# Patient Record
Sex: Male | Born: 1935 | Race: White | Hispanic: No | Marital: Married | Smoking: Former smoker
Health system: Southern US, Community
[De-identification: ages and names within clinical notes are randomized; demographics above are authoritative.]

## PROBLEM LIST (undated history)

## (undated) DIAGNOSIS — G8929 Other chronic pain: Secondary | ICD-10-CM

## (undated) DIAGNOSIS — F329 Major depressive disorder, single episode, unspecified: Secondary | ICD-10-CM

## (undated) DIAGNOSIS — E785 Hyperlipidemia, unspecified: Secondary | ICD-10-CM

## (undated) DIAGNOSIS — M542 Cervicalgia: Secondary | ICD-10-CM

## (undated) DIAGNOSIS — K219 Gastro-esophageal reflux disease without esophagitis: Secondary | ICD-10-CM

## (undated) DIAGNOSIS — N400 Enlarged prostate without lower urinary tract symptoms: Secondary | ICD-10-CM

## (undated) DIAGNOSIS — E079 Disorder of thyroid, unspecified: Secondary | ICD-10-CM

## (undated) DIAGNOSIS — K5730 Diverticulosis of large intestine without perforation or abscess without bleeding: Secondary | ICD-10-CM

## (undated) DIAGNOSIS — K279 Peptic ulcer, site unspecified, unspecified as acute or chronic, without hemorrhage or perforation: Secondary | ICD-10-CM

## (undated) DIAGNOSIS — K635 Polyp of colon: Secondary | ICD-10-CM

## (undated) DIAGNOSIS — K8070 Calculus of gallbladder and bile duct without cholecystitis without obstruction: Secondary | ICD-10-CM

## (undated) DIAGNOSIS — I10 Essential (primary) hypertension: Secondary | ICD-10-CM

## (undated) DIAGNOSIS — I2510 Atherosclerotic heart disease of native coronary artery without angina pectoris: Secondary | ICD-10-CM

## (undated) DIAGNOSIS — R911 Solitary pulmonary nodule: Secondary | ICD-10-CM

## (undated) DIAGNOSIS — F32A Depression, unspecified: Secondary | ICD-10-CM

## (undated) DIAGNOSIS — J449 Chronic obstructive pulmonary disease, unspecified: Secondary | ICD-10-CM

## (undated) DIAGNOSIS — I729 Aneurysm of unspecified site: Secondary | ICD-10-CM

## (undated) DIAGNOSIS — M8580 Other specified disorders of bone density and structure, unspecified site: Secondary | ICD-10-CM

## (undated) DIAGNOSIS — M47816 Spondylosis without myelopathy or radiculopathy, lumbar region: Secondary | ICD-10-CM

## (undated) HISTORY — DX: Calculus of gallbladder and bile duct without cholecystitis without obstruction: K80.70

## (undated) HISTORY — DX: Polyp of colon: K63.5

## (undated) HISTORY — DX: Major depressive disorder, single episode, unspecified: F32.9

## (undated) HISTORY — DX: Disorder of thyroid, unspecified: E07.9

## (undated) HISTORY — DX: Depression, unspecified: F32.A

## (undated) HISTORY — DX: Atherosclerotic heart disease of native coronary artery without angina pectoris: I25.10

## (undated) HISTORY — PX: INGUINAL HERNIA REPAIR: SHX194

## (undated) HISTORY — DX: Essential (primary) hypertension: I10

## (undated) HISTORY — DX: Chronic obstructive pulmonary disease, unspecified: J44.9

## (undated) HISTORY — DX: Gastro-esophageal reflux disease without esophagitis: K21.9

## (undated) HISTORY — DX: Hyperlipidemia, unspecified: E78.5

## (undated) HISTORY — DX: Other chronic pain: G89.29

## (undated) HISTORY — DX: Other specified disorders of bone density and structure, unspecified site: M85.80

## (undated) HISTORY — DX: Peptic ulcer, site unspecified, unspecified as acute or chronic, without hemorrhage or perforation: K27.9

## (undated) HISTORY — DX: Diverticulosis of large intestine without perforation or abscess without bleeding: K57.30

## (undated) HISTORY — DX: Cervicalgia: M54.2

## (undated) HISTORY — DX: Aneurysm of unspecified site: I72.9

## (undated) HISTORY — DX: Spondylosis without myelopathy or radiculopathy, lumbar region: M47.816

## (undated) HISTORY — DX: Solitary pulmonary nodule: R91.1

## (undated) HISTORY — DX: Benign prostatic hyperplasia without lower urinary tract symptoms: N40.0

## (undated) HISTORY — PX: ARTHOSCOPIC ROTAOR CUFF REPAIR: SHX5002

---

## 1976-12-18 HISTORY — PX: OTHER SURGICAL HISTORY: SHX169

## 2001-02-18 ENCOUNTER — Encounter (INDEPENDENT_AMBULATORY_CARE_PROVIDER_SITE_OTHER): Payer: Self-pay

## 2001-02-18 ENCOUNTER — Other Ambulatory Visit: Admission: RE | Admit: 2001-02-18 | Discharge: 2001-02-18 | Payer: Self-pay | Admitting: Gastroenterology

## 2001-03-07 ENCOUNTER — Encounter: Admission: RE | Admit: 2001-03-07 | Discharge: 2001-06-05 | Payer: Self-pay | Admitting: Neurology

## 2001-12-31 ENCOUNTER — Encounter: Payer: Self-pay | Admitting: Specialist

## 2002-01-07 ENCOUNTER — Inpatient Hospital Stay (HOSPITAL_COMMUNITY): Admission: RE | Admit: 2002-01-07 | Discharge: 2002-01-08 | Payer: Self-pay | Admitting: Orthopedic Surgery

## 2002-08-27 ENCOUNTER — Encounter: Payer: Self-pay | Admitting: Internal Medicine

## 2002-08-27 ENCOUNTER — Encounter: Admission: RE | Admit: 2002-08-27 | Discharge: 2002-08-27 | Payer: Self-pay | Admitting: Internal Medicine

## 2002-11-27 ENCOUNTER — Ambulatory Visit (HOSPITAL_BASED_OUTPATIENT_CLINIC_OR_DEPARTMENT_OTHER): Admission: RE | Admit: 2002-11-27 | Discharge: 2002-11-28 | Payer: Self-pay | Admitting: Orthopedic Surgery

## 2003-02-13 ENCOUNTER — Encounter: Admission: RE | Admit: 2003-02-13 | Discharge: 2003-02-13 | Payer: Self-pay | Admitting: Rheumatology

## 2003-02-13 ENCOUNTER — Encounter: Payer: Self-pay | Admitting: Rheumatology

## 2003-06-29 ENCOUNTER — Emergency Department (HOSPITAL_COMMUNITY): Admission: EM | Admit: 2003-06-29 | Discharge: 2003-06-29 | Payer: Self-pay | Admitting: Emergency Medicine

## 2003-12-31 ENCOUNTER — Encounter: Admission: RE | Admit: 2003-12-31 | Discharge: 2003-12-31 | Payer: Self-pay | Admitting: Internal Medicine

## 2004-12-02 ENCOUNTER — Encounter: Admission: RE | Admit: 2004-12-02 | Discharge: 2004-12-02 | Payer: Self-pay | Admitting: Specialist

## 2004-12-21 ENCOUNTER — Encounter: Admission: RE | Admit: 2004-12-21 | Discharge: 2004-12-21 | Payer: Self-pay | Admitting: Specialist

## 2005-01-13 ENCOUNTER — Encounter: Admission: RE | Admit: 2005-01-13 | Discharge: 2005-01-13 | Payer: Self-pay | Admitting: Specialist

## 2005-02-03 ENCOUNTER — Ambulatory Visit: Payer: Self-pay | Admitting: Internal Medicine

## 2005-03-03 ENCOUNTER — Ambulatory Visit: Payer: Self-pay | Admitting: Internal Medicine

## 2005-03-20 ENCOUNTER — Ambulatory Visit: Payer: Self-pay | Admitting: Internal Medicine

## 2005-04-11 ENCOUNTER — Emergency Department (HOSPITAL_COMMUNITY): Admission: EM | Admit: 2005-04-11 | Discharge: 2005-04-12 | Payer: Self-pay | Admitting: Emergency Medicine

## 2005-04-21 ENCOUNTER — Emergency Department (HOSPITAL_COMMUNITY): Admission: EM | Admit: 2005-04-21 | Discharge: 2005-04-21 | Payer: Self-pay | Admitting: Family Medicine

## 2005-05-03 ENCOUNTER — Encounter: Admission: RE | Admit: 2005-05-03 | Discharge: 2005-05-03 | Payer: Self-pay | Admitting: Urology

## 2005-05-04 ENCOUNTER — Ambulatory Visit (HOSPITAL_COMMUNITY): Admission: RE | Admit: 2005-05-04 | Discharge: 2005-05-04 | Payer: Self-pay | Admitting: Urology

## 2005-05-04 ENCOUNTER — Ambulatory Visit (HOSPITAL_BASED_OUTPATIENT_CLINIC_OR_DEPARTMENT_OTHER): Admission: RE | Admit: 2005-05-04 | Discharge: 2005-05-04 | Payer: Self-pay | Admitting: Urology

## 2005-05-04 ENCOUNTER — Encounter (INDEPENDENT_AMBULATORY_CARE_PROVIDER_SITE_OTHER): Payer: Self-pay | Admitting: Specialist

## 2005-05-11 ENCOUNTER — Ambulatory Visit (HOSPITAL_COMMUNITY): Admission: RE | Admit: 2005-05-11 | Discharge: 2005-05-11 | Payer: Self-pay | Admitting: Urology

## 2005-05-11 ENCOUNTER — Encounter (INDEPENDENT_AMBULATORY_CARE_PROVIDER_SITE_OTHER): Payer: Self-pay | Admitting: Specialist

## 2005-06-21 ENCOUNTER — Encounter: Admission: RE | Admit: 2005-06-21 | Discharge: 2005-08-24 | Payer: Self-pay | Admitting: Internal Medicine

## 2005-09-01 ENCOUNTER — Ambulatory Visit: Payer: Self-pay | Admitting: Internal Medicine

## 2006-03-29 ENCOUNTER — Ambulatory Visit: Payer: Self-pay | Admitting: Internal Medicine

## 2006-03-30 ENCOUNTER — Ambulatory Visit: Payer: Self-pay | Admitting: Internal Medicine

## 2006-04-04 ENCOUNTER — Inpatient Hospital Stay (HOSPITAL_BASED_OUTPATIENT_CLINIC_OR_DEPARTMENT_OTHER): Admission: RE | Admit: 2006-04-04 | Discharge: 2006-04-04 | Payer: Self-pay | Admitting: Internal Medicine

## 2006-04-04 ENCOUNTER — Ambulatory Visit: Payer: Self-pay | Admitting: Internal Medicine

## 2006-04-13 ENCOUNTER — Ambulatory Visit: Payer: Self-pay | Admitting: Internal Medicine

## 2006-04-17 ENCOUNTER — Ambulatory Visit: Payer: Self-pay | Admitting: Cardiology

## 2006-05-10 ENCOUNTER — Ambulatory Visit: Payer: Self-pay | Admitting: Internal Medicine

## 2006-08-16 ENCOUNTER — Ambulatory Visit: Payer: Self-pay | Admitting: Internal Medicine

## 2006-09-26 ENCOUNTER — Ambulatory Visit: Payer: Self-pay | Admitting: Internal Medicine

## 2006-10-19 ENCOUNTER — Emergency Department (HOSPITAL_COMMUNITY): Admission: EM | Admit: 2006-10-19 | Discharge: 2006-10-19 | Payer: Self-pay | Admitting: Emergency Medicine

## 2006-11-06 ENCOUNTER — Ambulatory Visit: Payer: Self-pay | Admitting: Internal Medicine

## 2006-11-15 ENCOUNTER — Ambulatory Visit: Payer: Self-pay | Admitting: Internal Medicine

## 2006-11-16 ENCOUNTER — Ambulatory Visit: Payer: Self-pay | Admitting: Cardiology

## 2006-11-16 ENCOUNTER — Ambulatory Visit: Payer: Self-pay | Admitting: Gastroenterology

## 2006-11-22 ENCOUNTER — Ambulatory Visit: Payer: Self-pay | Admitting: Gastroenterology

## 2006-11-29 ENCOUNTER — Ambulatory Visit: Payer: Self-pay | Admitting: Gastroenterology

## 2006-12-04 ENCOUNTER — Ambulatory Visit: Payer: Self-pay | Admitting: Gastroenterology

## 2006-12-17 ENCOUNTER — Ambulatory Visit: Payer: Self-pay | Admitting: Gastroenterology

## 2006-12-17 LAB — CONVERTED CEMR LAB
AST: 35 units/L (ref 0–37)
Albumin: 4 g/dL (ref 3.5–5.2)
Alkaline Phosphatase: 75 units/L (ref 39–117)
Total Bilirubin: 1.2 mg/dL (ref 0.3–1.2)

## 2007-01-01 ENCOUNTER — Ambulatory Visit: Payer: Self-pay | Admitting: Gastroenterology

## 2007-01-16 ENCOUNTER — Encounter: Payer: Self-pay | Admitting: Internal Medicine

## 2007-01-16 ENCOUNTER — Encounter (INDEPENDENT_AMBULATORY_CARE_PROVIDER_SITE_OTHER): Payer: Self-pay | Admitting: *Deleted

## 2007-01-16 ENCOUNTER — Ambulatory Visit: Payer: Self-pay | Admitting: Gastroenterology

## 2007-01-22 DIAGNOSIS — H269 Unspecified cataract: Secondary | ICD-10-CM

## 2007-01-22 DIAGNOSIS — J449 Chronic obstructive pulmonary disease, unspecified: Secondary | ICD-10-CM

## 2007-01-22 DIAGNOSIS — N400 Enlarged prostate without lower urinary tract symptoms: Secondary | ICD-10-CM

## 2007-01-22 DIAGNOSIS — Z860100 Personal history of colon polyps, unspecified: Secondary | ICD-10-CM | POA: Insufficient documentation

## 2007-01-22 DIAGNOSIS — K219 Gastro-esophageal reflux disease without esophagitis: Secondary | ICD-10-CM

## 2007-01-22 DIAGNOSIS — K5730 Diverticulosis of large intestine without perforation or abscess without bleeding: Secondary | ICD-10-CM | POA: Insufficient documentation

## 2007-01-22 DIAGNOSIS — Z85828 Personal history of other malignant neoplasm of skin: Secondary | ICD-10-CM

## 2007-01-22 DIAGNOSIS — R945 Abnormal results of liver function studies: Secondary | ICD-10-CM

## 2007-01-22 DIAGNOSIS — F418 Other specified anxiety disorders: Secondary | ICD-10-CM

## 2007-01-22 DIAGNOSIS — E785 Hyperlipidemia, unspecified: Secondary | ICD-10-CM | POA: Insufficient documentation

## 2007-01-22 DIAGNOSIS — K279 Peptic ulcer, site unspecified, unspecified as acute or chronic, without hemorrhage or perforation: Secondary | ICD-10-CM | POA: Insufficient documentation

## 2007-01-28 ENCOUNTER — Ambulatory Visit: Payer: Self-pay | Admitting: Internal Medicine

## 2007-06-24 ENCOUNTER — Ambulatory Visit: Payer: Self-pay | Admitting: Internal Medicine

## 2007-06-24 ENCOUNTER — Encounter (INDEPENDENT_AMBULATORY_CARE_PROVIDER_SITE_OTHER): Payer: Self-pay | Admitting: *Deleted

## 2007-06-27 ENCOUNTER — Telehealth (INDEPENDENT_AMBULATORY_CARE_PROVIDER_SITE_OTHER): Payer: Self-pay | Admitting: *Deleted

## 2007-06-28 ENCOUNTER — Ambulatory Visit: Payer: Self-pay | Admitting: Internal Medicine

## 2007-07-01 ENCOUNTER — Telehealth: Payer: Self-pay | Admitting: Internal Medicine

## 2007-12-10 ENCOUNTER — Ambulatory Visit: Payer: Self-pay | Admitting: Internal Medicine

## 2007-12-10 DIAGNOSIS — M542 Cervicalgia: Secondary | ICD-10-CM

## 2007-12-13 ENCOUNTER — Ambulatory Visit: Payer: Self-pay | Admitting: Internal Medicine

## 2007-12-20 ENCOUNTER — Telehealth: Payer: Self-pay | Admitting: Internal Medicine

## 2008-01-10 ENCOUNTER — Encounter: Payer: Self-pay | Admitting: Internal Medicine

## 2008-01-20 ENCOUNTER — Encounter: Payer: Self-pay | Admitting: Internal Medicine

## 2008-01-24 ENCOUNTER — Telehealth (INDEPENDENT_AMBULATORY_CARE_PROVIDER_SITE_OTHER): Payer: Self-pay | Admitting: *Deleted

## 2008-01-27 ENCOUNTER — Ambulatory Visit: Payer: Self-pay | Admitting: Internal Medicine

## 2008-01-27 DIAGNOSIS — J984 Other disorders of lung: Secondary | ICD-10-CM

## 2008-01-28 LAB — CONVERTED CEMR LAB
CO2: 27 meq/L (ref 19–32)
GFR calc Af Amer: 94 mL/min
Glucose, Bld: 77 mg/dL (ref 70–99)
Potassium: 4.5 meq/L (ref 3.5–5.1)

## 2008-02-05 ENCOUNTER — Ambulatory Visit: Payer: Self-pay | Admitting: Internal Medicine

## 2008-02-07 ENCOUNTER — Ambulatory Visit: Payer: Self-pay | Admitting: Internal Medicine

## 2008-02-07 DIAGNOSIS — N402 Nodular prostate without lower urinary tract symptoms: Secondary | ICD-10-CM

## 2008-02-07 DIAGNOSIS — I2510 Atherosclerotic heart disease of native coronary artery without angina pectoris: Secondary | ICD-10-CM | POA: Insufficient documentation

## 2008-02-07 DIAGNOSIS — M8580 Other specified disorders of bone density and structure, unspecified site: Secondary | ICD-10-CM

## 2008-02-11 ENCOUNTER — Encounter (INDEPENDENT_AMBULATORY_CARE_PROVIDER_SITE_OTHER): Payer: Self-pay | Admitting: *Deleted

## 2008-02-12 LAB — CONVERTED CEMR LAB
ALT: 104 units/L — ABNORMAL HIGH (ref 0–53)
AST: 139 units/L — ABNORMAL HIGH (ref 0–37)
Alkaline Phosphatase: 63 units/L (ref 39–117)
Basophils Relative: 0.5 % (ref 0.0–1.0)
Eosinophils Relative: 3.2 % (ref 0.0–5.0)
Hemoglobin: 15.4 g/dL (ref 13.0–17.0)
Lymphocytes Relative: 26.8 % (ref 12.0–46.0)
Neutro Abs: 4.9 10*3/uL (ref 1.4–7.7)
Platelets: 346 10*3/uL (ref 150–400)
TSH: 3.19 microintl units/mL (ref 0.35–5.50)
Total Bilirubin: 0.9 mg/dL (ref 0.3–1.2)
Total Protein: 7.2 g/dL (ref 6.0–8.3)
Triglycerides: 114 mg/dL (ref 0–149)
VLDL: 23 mg/dL (ref 0–40)
WBC: 8.3 10*3/uL (ref 4.5–10.5)

## 2008-03-16 ENCOUNTER — Encounter: Admission: RE | Admit: 2008-03-16 | Discharge: 2008-04-28 | Payer: Self-pay | Admitting: Orthopedic Surgery

## 2008-05-19 ENCOUNTER — Telehealth (INDEPENDENT_AMBULATORY_CARE_PROVIDER_SITE_OTHER): Payer: Self-pay | Admitting: *Deleted

## 2008-05-20 ENCOUNTER — Ambulatory Visit: Payer: Self-pay | Admitting: Internal Medicine

## 2008-05-20 DIAGNOSIS — R945 Abnormal results of liver function studies: Secondary | ICD-10-CM | POA: Insufficient documentation

## 2008-05-22 ENCOUNTER — Encounter (INDEPENDENT_AMBULATORY_CARE_PROVIDER_SITE_OTHER): Payer: Self-pay | Admitting: *Deleted

## 2008-05-28 ENCOUNTER — Encounter: Payer: Self-pay | Admitting: Internal Medicine

## 2008-06-01 ENCOUNTER — Ambulatory Visit: Payer: Self-pay | Admitting: Internal Medicine

## 2008-06-03 ENCOUNTER — Telehealth (INDEPENDENT_AMBULATORY_CARE_PROVIDER_SITE_OTHER): Payer: Self-pay | Admitting: *Deleted

## 2008-06-04 ENCOUNTER — Telehealth (INDEPENDENT_AMBULATORY_CARE_PROVIDER_SITE_OTHER): Payer: Self-pay | Admitting: *Deleted

## 2008-06-05 LAB — CONVERTED CEMR LAB
Alkaline Phosphatase: 76 units/L (ref 39–117)
Basophils Absolute: 0 10*3/uL (ref 0.0–0.1)
Bilirubin, Direct: 0.1 mg/dL (ref 0.0–0.3)
Calcium: 9.1 mg/dL (ref 8.4–10.5)
Eosinophils Absolute: 0.1 10*3/uL (ref 0.0–0.7)
GFR calc Af Amer: 77 mL/min
GFR calc non Af Amer: 63 mL/min
HCT: 44.4 % (ref 39.0–52.0)
MCHC: 34.5 g/dL (ref 30.0–36.0)
MCV: 103.1 fL — ABNORMAL HIGH (ref 78.0–100.0)
Monocytes Absolute: 0.8 10*3/uL (ref 0.1–1.0)
Platelets: 266 10*3/uL (ref 150–400)
Potassium: 4.6 meq/L (ref 3.5–5.1)
RDW: 13.2 % (ref 11.5–14.6)
Sodium: 139 meq/L (ref 135–145)

## 2008-06-12 ENCOUNTER — Encounter: Payer: Self-pay | Admitting: Internal Medicine

## 2008-06-29 ENCOUNTER — Encounter: Payer: Self-pay | Admitting: Internal Medicine

## 2008-07-23 ENCOUNTER — Encounter: Payer: Self-pay | Admitting: Internal Medicine

## 2008-08-20 ENCOUNTER — Encounter: Payer: Self-pay | Admitting: Internal Medicine

## 2008-08-27 ENCOUNTER — Emergency Department (HOSPITAL_COMMUNITY): Admission: EM | Admit: 2008-08-27 | Discharge: 2008-08-28 | Payer: Self-pay | Admitting: Emergency Medicine

## 2008-08-31 ENCOUNTER — Ambulatory Visit: Payer: Self-pay | Admitting: Internal Medicine

## 2008-08-31 DIAGNOSIS — I720 Aneurysm of carotid artery: Secondary | ICD-10-CM | POA: Insufficient documentation

## 2008-09-03 ENCOUNTER — Telehealth: Payer: Self-pay | Admitting: Internal Medicine

## 2008-09-03 ENCOUNTER — Telehealth (INDEPENDENT_AMBULATORY_CARE_PROVIDER_SITE_OTHER): Payer: Self-pay | Admitting: *Deleted

## 2008-09-23 ENCOUNTER — Telehealth (INDEPENDENT_AMBULATORY_CARE_PROVIDER_SITE_OTHER): Payer: Self-pay | Admitting: *Deleted

## 2008-10-15 ENCOUNTER — Encounter: Payer: Self-pay | Admitting: Internal Medicine

## 2008-10-16 ENCOUNTER — Telehealth (INDEPENDENT_AMBULATORY_CARE_PROVIDER_SITE_OTHER): Payer: Self-pay | Admitting: *Deleted

## 2008-11-02 ENCOUNTER — Ambulatory Visit: Payer: Self-pay | Admitting: Internal Medicine

## 2008-11-02 DIAGNOSIS — R799 Abnormal finding of blood chemistry, unspecified: Secondary | ICD-10-CM | POA: Insufficient documentation

## 2008-11-02 LAB — CONVERTED CEMR LAB: Hep B Core Total Ab: NEGATIVE

## 2008-11-04 ENCOUNTER — Telehealth (INDEPENDENT_AMBULATORY_CARE_PROVIDER_SITE_OTHER): Payer: Self-pay | Admitting: *Deleted

## 2008-11-04 LAB — CONVERTED CEMR LAB
ALT: 42 units/L (ref 0–53)
AST: 52 units/L — ABNORMAL HIGH (ref 0–37)
Bilirubin, Direct: 0.1 mg/dL (ref 0.0–0.3)
Folate: >20 ng/mL
Total Bilirubin: 0.8 mg/dL (ref 0.3–1.2)

## 2008-11-13 ENCOUNTER — Telehealth (INDEPENDENT_AMBULATORY_CARE_PROVIDER_SITE_OTHER): Payer: Self-pay | Admitting: *Deleted

## 2008-11-23 ENCOUNTER — Encounter: Payer: Self-pay | Admitting: Internal Medicine

## 2009-01-08 ENCOUNTER — Encounter: Payer: Self-pay | Admitting: Internal Medicine

## 2009-01-25 ENCOUNTER — Ambulatory Visit: Payer: Self-pay | Admitting: Internal Medicine

## 2009-01-25 ENCOUNTER — Telehealth (INDEPENDENT_AMBULATORY_CARE_PROVIDER_SITE_OTHER): Payer: Self-pay | Admitting: *Deleted

## 2009-02-08 ENCOUNTER — Encounter: Payer: Self-pay | Admitting: Internal Medicine

## 2009-02-22 ENCOUNTER — Encounter: Payer: Self-pay | Admitting: Internal Medicine

## 2009-02-24 ENCOUNTER — Encounter: Payer: Self-pay | Admitting: Internal Medicine

## 2009-03-02 ENCOUNTER — Encounter: Payer: Self-pay | Admitting: Cardiovascular Disease

## 2009-03-02 ENCOUNTER — Ambulatory Visit: Payer: Self-pay | Admitting: Internal Medicine

## 2009-03-03 ENCOUNTER — Telehealth: Payer: Self-pay | Admitting: Internal Medicine

## 2009-03-04 ENCOUNTER — Telehealth: Payer: Self-pay | Admitting: Internal Medicine

## 2009-03-04 ENCOUNTER — Encounter: Payer: Self-pay | Admitting: Internal Medicine

## 2009-03-12 ENCOUNTER — Ambulatory Visit: Payer: Self-pay | Admitting: Internal Medicine

## 2009-03-12 DIAGNOSIS — E291 Testicular hypofunction: Secondary | ICD-10-CM

## 2009-04-26 ENCOUNTER — Ambulatory Visit: Payer: Self-pay | Admitting: Internal Medicine

## 2009-05-13 ENCOUNTER — Telehealth (INDEPENDENT_AMBULATORY_CARE_PROVIDER_SITE_OTHER): Payer: Self-pay | Admitting: *Deleted

## 2009-05-13 ENCOUNTER — Encounter (INDEPENDENT_AMBULATORY_CARE_PROVIDER_SITE_OTHER): Payer: Self-pay | Admitting: *Deleted

## 2009-05-13 LAB — CONVERTED CEMR LAB
Alkaline Phosphatase: 61 units/L (ref 39–117)
Bilirubin, Direct: 0.1 mg/dL (ref 0.0–0.3)
FSH: 8 milliintl units/mL (ref 1.4–18.1)
LH: 6.26 milliintl units/mL (ref 3.10–34.60)
Saturation Ratios: 9 % — ABNORMAL LOW (ref 20.0–50.0)
Testosterone: 284.58 ng/dL — ABNORMAL LOW (ref 350.00–890.00)
Total Bilirubin: 0.7 mg/dL (ref 0.3–1.2)
Total Protein: 6.8 g/dL (ref 6.0–8.3)
Transferrin: 277 mg/dL (ref 212.0–360.0)

## 2009-06-01 ENCOUNTER — Telehealth (INDEPENDENT_AMBULATORY_CARE_PROVIDER_SITE_OTHER): Payer: Self-pay | Admitting: *Deleted

## 2009-07-15 ENCOUNTER — Ambulatory Visit: Payer: Self-pay | Admitting: Internal Medicine

## 2009-07-15 ENCOUNTER — Telehealth (INDEPENDENT_AMBULATORY_CARE_PROVIDER_SITE_OTHER): Payer: Self-pay | Admitting: *Deleted

## 2009-07-15 DIAGNOSIS — D509 Iron deficiency anemia, unspecified: Secondary | ICD-10-CM

## 2009-07-16 ENCOUNTER — Encounter (INDEPENDENT_AMBULATORY_CARE_PROVIDER_SITE_OTHER): Payer: Self-pay | Admitting: *Deleted

## 2009-07-19 DIAGNOSIS — I4949 Other premature depolarization: Secondary | ICD-10-CM

## 2009-07-19 DIAGNOSIS — R002 Palpitations: Secondary | ICD-10-CM | POA: Insufficient documentation

## 2009-07-19 DIAGNOSIS — R079 Chest pain, unspecified: Secondary | ICD-10-CM | POA: Insufficient documentation

## 2009-07-21 ENCOUNTER — Ambulatory Visit: Payer: Self-pay | Admitting: Cardiovascular Disease

## 2009-07-21 DIAGNOSIS — R0602 Shortness of breath: Secondary | ICD-10-CM

## 2009-07-23 LAB — CONVERTED CEMR LAB
ALT: 44 units/L (ref 0–53)
Albumin: 3.9 g/dL (ref 3.5–5.2)
Bilirubin, Direct: 0 mg/dL (ref 0.0–0.3)
Total Protein: 7.2 g/dL (ref 6.0–8.3)
Transferrin: 304.3 mg/dL (ref 212.0–360.0)

## 2009-07-26 LAB — CONVERTED CEMR LAB
BUN: 12 mg/dL (ref 6–23)
Chloride: 107 meq/L (ref 96–112)
Creatinine, Ser: 1.3 mg/dL (ref 0.4–1.5)
GFR calc non Af Amer: 57.35 mL/min (ref >60)
Glucose, Bld: 96 mg/dL (ref 70–99)
Potassium: 4.7 meq/L (ref 3.5–5.1)

## 2009-07-27 ENCOUNTER — Ambulatory Visit: Payer: Self-pay | Admitting: Internal Medicine

## 2009-07-29 ENCOUNTER — Encounter: Payer: Self-pay | Admitting: Internal Medicine

## 2009-07-30 ENCOUNTER — Ambulatory Visit: Payer: Self-pay

## 2009-07-30 ENCOUNTER — Encounter: Payer: Self-pay | Admitting: Cardiovascular Disease

## 2009-08-02 ENCOUNTER — Telehealth: Payer: Self-pay | Admitting: Cardiovascular Disease

## 2009-08-12 ENCOUNTER — Encounter: Payer: Self-pay | Admitting: Internal Medicine

## 2009-09-10 ENCOUNTER — Encounter: Payer: Self-pay | Admitting: Internal Medicine

## 2009-09-20 ENCOUNTER — Telehealth (INDEPENDENT_AMBULATORY_CARE_PROVIDER_SITE_OTHER): Payer: Self-pay | Admitting: *Deleted

## 2009-09-21 ENCOUNTER — Telehealth (INDEPENDENT_AMBULATORY_CARE_PROVIDER_SITE_OTHER): Payer: Self-pay | Admitting: *Deleted

## 2009-10-13 ENCOUNTER — Encounter: Payer: Self-pay | Admitting: Internal Medicine

## 2009-10-22 ENCOUNTER — Telehealth: Payer: Self-pay | Admitting: Internal Medicine

## 2009-11-05 ENCOUNTER — Telehealth: Payer: Self-pay | Admitting: Internal Medicine

## 2009-11-08 ENCOUNTER — Telehealth (INDEPENDENT_AMBULATORY_CARE_PROVIDER_SITE_OTHER): Payer: Self-pay | Admitting: *Deleted

## 2009-11-26 ENCOUNTER — Ambulatory Visit: Payer: Self-pay | Admitting: Internal Medicine

## 2009-11-26 LAB — CONVERTED CEMR LAB
Bilirubin Urine: NEGATIVE
Ketones, urine, test strip: NEGATIVE
Nitrite: NEGATIVE
Protein, U semiquant: NEGATIVE
Urobilinogen, UA: 0.2

## 2009-11-29 ENCOUNTER — Encounter: Payer: Self-pay | Admitting: Internal Medicine

## 2009-11-30 ENCOUNTER — Telehealth (INDEPENDENT_AMBULATORY_CARE_PROVIDER_SITE_OTHER): Payer: Self-pay | Admitting: *Deleted

## 2009-12-01 ENCOUNTER — Ambulatory Visit: Payer: Self-pay | Admitting: Internal Medicine

## 2009-12-02 LAB — CONVERTED CEMR LAB
BUN: 11 mg/dL (ref 6–23)
Basophils Absolute: 0.1 10*3/uL (ref 0.0–0.1)
Basophils Relative: 0.8 % (ref 0.0–3.0)
CO2: 27 meq/L (ref 19–32)
Chloride: 107 meq/L (ref 96–112)
Cholesterol: 117 mg/dL (ref 0–200)
Creatinine, Ser: 1.1 mg/dL (ref 0.4–1.5)
Eosinophils Absolute: 0.2 10*3/uL (ref 0.0–0.7)
MCHC: 33.4 g/dL (ref 30.0–36.0)
MCV: 102.4 fL — ABNORMAL HIGH (ref 78.0–100.0)
Monocytes Absolute: 0.9 10*3/uL (ref 0.1–1.0)
Neutrophils Relative %: 56.7 % (ref 43.0–77.0)
Platelets: 259 10*3/uL (ref 150.0–400.0)
RDW: 13.3 % (ref 11.5–14.6)
Total CHOL/HDL Ratio: 3
Triglycerides: 104 mg/dL (ref 0.0–149.0)

## 2009-12-03 ENCOUNTER — Ambulatory Visit: Payer: Self-pay | Admitting: Cardiology

## 2010-01-26 ENCOUNTER — Telehealth: Payer: Self-pay | Admitting: Internal Medicine

## 2010-03-07 ENCOUNTER — Ambulatory Visit: Payer: Self-pay | Admitting: Family

## 2010-03-10 ENCOUNTER — Telehealth (INDEPENDENT_AMBULATORY_CARE_PROVIDER_SITE_OTHER): Payer: Self-pay | Admitting: *Deleted

## 2010-03-30 ENCOUNTER — Telehealth: Payer: Self-pay | Admitting: Internal Medicine

## 2010-04-17 HISTORY — PX: SPINAL CORD STIMULATOR INSERTION: SHX5378

## 2010-04-20 ENCOUNTER — Ambulatory Visit (HOSPITAL_COMMUNITY): Admission: RE | Admit: 2010-04-20 | Discharge: 2010-04-21 | Payer: Self-pay | Admitting: Orthopedic Surgery

## 2010-05-20 ENCOUNTER — Telehealth: Payer: Self-pay | Admitting: Internal Medicine

## 2010-05-31 ENCOUNTER — Telehealth: Payer: Self-pay | Admitting: Internal Medicine

## 2010-06-01 ENCOUNTER — Encounter (INDEPENDENT_AMBULATORY_CARE_PROVIDER_SITE_OTHER): Payer: Self-pay | Admitting: *Deleted

## 2010-07-01 ENCOUNTER — Ambulatory Visit: Payer: Self-pay | Admitting: Internal Medicine

## 2010-07-05 LAB — CONVERTED CEMR LAB
AST: 39 units/L — ABNORMAL HIGH (ref 0–37)
Bilirubin, Direct: 0.1 mg/dL (ref 0.0–0.3)
Indirect Bilirubin: 0.5 mg/dL (ref 0.0–0.9)
PSA: 1.71 ng/mL (ref 0.10–4.00)
Total Bilirubin: 0.6 mg/dL (ref 0.3–1.2)

## 2010-08-17 ENCOUNTER — Telehealth: Payer: Self-pay | Admitting: Internal Medicine

## 2010-08-23 ENCOUNTER — Encounter: Payer: Self-pay | Admitting: Internal Medicine

## 2010-12-21 ENCOUNTER — Encounter: Payer: Self-pay | Admitting: Internal Medicine

## 2011-01-04 ENCOUNTER — Encounter: Payer: Self-pay | Admitting: Internal Medicine

## 2011-01-15 LAB — CONVERTED CEMR LAB
ALT: 121 units/L — ABNORMAL HIGH (ref 0–53)
AST: 106 units/L — ABNORMAL HIGH (ref 0–37)
Alkaline Phosphatase: 70 units/L (ref 39–117)
BUN: 22 mg/dL (ref 6–23)
Basophils Relative: 1.6 % (ref 0.0–3.0)
Chloride: 104 meq/L (ref 96–112)
Cholesterol: 244 mg/dL — ABNORMAL HIGH (ref 0–200)
Direct LDL: 161.7 mg/dL
Eosinophils Absolute: 0.1 10*3/uL (ref 0.0–0.7)
Eosinophils Relative: 0.8 % (ref 0.0–5.0)
GFR calc non Af Amer: 62.97 mL/min (ref >60)
Glucose, Bld: 88 mg/dL (ref 70–99)
Hemoglobin: 15.3 g/dL (ref 13.0–17.0)
Lymphocytes Relative: 12.4 % (ref 12.0–46.0)
MCHC: 33.9 g/dL (ref 30.0–36.0)
Monocytes Relative: 10 % (ref 3.0–12.0)
Neutro Abs: 7.7 10*3/uL (ref 1.4–7.7)
Neutrophils Relative %: 75.2 % (ref 43.0–77.0)
Potassium: 4.4 meq/L (ref 3.5–5.1)
RBC: 4.47 M/uL (ref 4.22–5.81)
Sodium: 142 meq/L (ref 135–145)
Testosterone: 127.13 ng/dL — ABNORMAL LOW (ref 350.00–890.00)
Total Bilirubin: 0.8 mg/dL (ref 0.3–1.2)
Total CHOL/HDL Ratio: 4
VLDL: 28.4 mg/dL (ref 0.0–40.0)
Vit D, 25-Hydroxy: 25 ng/mL — ABNORMAL LOW (ref 30–89)
WBC: 10.3 10*3/uL (ref 4.5–10.5)

## 2011-01-17 NOTE — Progress Notes (Signed)
Summary: Refill Request  Phone Note Refill Request Call back at [phone] Message from:  Pharmacy on May 31, 2010 12:09 PM  Refills Requested: Medication #1:  ALPRAZOLAM 0.5 MG  TB24 1/2 to 1 by mouth three times a day as needed anxiety and 1 by mouth at bedtime as needed insomnia   Dosage confirmed as above?Dosage Confirmed   Supply Requested: 1 month   Last Ref[facility]: [facility]/30/[facility] [address]  Next Appointment Scheduled: none Initial call taken by: Harold Barban,  May 31, 2010 12:10 PM  Follow-up for Phone Call        LAST OV 11-26-09, LAST FILLED 01-27-10 #60 3 .Marland KitchenMarland KitchenFelecia Deloach CMA  May 31, 2010 2:54 PM   ok 53, noRF please  tell  patient, he is due for a office visit Virda Betters E. Doxie Augenstein MD  June 01, 2010 4:08 PM  LEFT PT DETAIL MESSAGE OV DUE., letter mailed ........Marland KitchenFelecia Deloach CMA  June 01, 2010 4:18 PM     Prescriptions: ALPRAZOLAM 0.5 MG  TB24 (ALPRAZOLAM) 1/2 to 1 by mouth three times a day as needed anxiety and 1 by mouth at bedtime as needed insomnia  #60 x 0   Entered by:   Jeremy Johann CMA   Authorized by:   Nolon Rod. Trana Ressler MD   Signed by:   Jeremy Johann CMA on 06/01/2010   Method used:   Printed then faxed to ...       [facility]  [phone]* (retail)       [address] [place], [place]  [address]       Ph: [phone] or [phone]       Fax: [phone]   RxID:   [id number]

## 2011-01-17 NOTE — Progress Notes (Signed)
Summary: REFILL  Phone Note Refill Request Message from:  Fax from Pharmacy on [facility] [address] FAX [phone]  Refills Requested: Medication #1:  ALPRAZOLAM 0.5 MG  TB24 1/2 to 1 by mouth three times a day as needed anxiety and 1 by mouth at bedtime as needed insomnia Initial call taken by: Barb Merino,  January 26, 2010 9:59 AM  Follow-up for Phone Call        last filled 06-02-09 #60 3, last OV 11-26-09..................Marland KitchenFelecia Deloach CMA  January 26, 2010 3:26 PM   Additional Follow-up for Phone Call Additional follow up Details #1::        ok 60 and 3 RF Additional Follow-up by: Petrice Beedy E. Jeorgia Helming MD,  January 27, 2010 9:29 AM    Additional Follow-up for Phone Call Additional follow up Details #2::    faxed to pharmacy..............Marland KitchenFelecia Deloach CMA  January 27, 2010 10:55 AM   Prescriptions: ALPRAZOLAM 0.5 MG  TB24 (ALPRAZOLAM) 1/2 to 1 by mouth three times a day as needed anxiety and 1 by mouth at bedtime as needed insomnia  #60 x 3   Entered by:   Jeremy Johann CMA   Authorized by:   Nolon Rod. Satya Buttram MD   Signed by:   Jeremy Johann CMA on 01/27/2010   Method used:   Printed then faxed to ...       [facility]  [phone]* (retail)       [address] [place], [place]  [address]       Ph: [phone] or [phone]       Fax: [phone]   RxID:   [id number]

## 2011-01-17 NOTE — Progress Notes (Signed)
Summary: surgical clearance  Phone Note From Other Clinic   Caller: [facility] - Rosalva Ferron, fax [phone] Summary of Call: Patient will be scheduled for spinal cord stimulator.  Needs clearance. last ov with Manish Ruggiero - 11/26/09, due ROV nl ECHO 07/30/2009 PFT - 07/29/09 - showed moderate dz ov with Nishan 11/26/09 ok to clear? Shary Decamp  March 30, 2010 3:58 PM   Follow-up for Phone Call        chart is reviewed tolerated the surgery well 12/10 saw  cardiology 8-10, doing well the last time we talked  his COPD was well controlled Plan: Will clear him for surgery Will avoid perioperative beta-blockers due to his history of COPD Jena Tegeler E. Muaad Boehning MD  March 31, 2010 11:42 AM   Additional Follow-up for Phone Call Additional follow up Details #1::        faxed to sherry Additional Follow-up by: Shary Decamp,  March 31, 2010 11:46 AM     Appended Document: surgical clearance rx was refaxed to [phone] to Attn: Rosalva Ferron on 03/31/10  Appended Document: surgical clearance sherry called back, please fax to [phone].- rx refaxed

## 2011-01-17 NOTE — Progress Notes (Signed)
Summary: Refill Request  Phone Note Refill Request Call back at [phone] Message from:  Pharmacy on May 20, 2010 10:35 AM  Refills Requested: Medication #1:  AMBIEN 10 MG TABS 1 by mouth at bedtime as needed   Dosage confirmed as above?Dosage Confirmed   Supply Requested: 1 month   [facility] [address]  Next Appointment Scheduled: none Initial call taken by: Harold Barban,  May 20, 2010 10:36 AM  Follow-up for Phone Call        last filled 03-10-10 #30 1, last OV 11-26-10 no pending  appt..............Marland KitchenFelecia Deloach CMA  May 20, 2010 12:16 PM   Additional Follow-up for Phone Call Additional follow up Details #1::        ok 30 and 3 RF Loreta Blouch E. Arbadella Kimbler MD  May 20, 2010 2:51 PM     Prescriptions: AMBIEN 10 MG TABS (ZOLPIDEM TARTRATE) 1 by mouth at bedtime as needed  #30 x 3   Entered by:   Jeremy Johann CMA   Authorized by:   Nolon Rod. Tyaira Heward MD   Signed by:   Jeremy Johann CMA on 05/20/2010   Method used:   Printed then faxed to ...       [facility]  [phone]* (retail)       [address] [place], [place]  [address]       Ph: [phone] or [phone]       Fax: [phone]   RxID:   [id number]

## 2011-01-17 NOTE — Letter (Signed)
Summary: Primary Care Appointment Letter  [facility]  [address] [place], [place] [address]   Phone: [phone]  Fax: [phone]    06/01/2010 MRN: [id number]  [facility] [address] [place], [place]  [address]  Dear Mr. BUSTA,   Your Primary Care Physician [place] E. Paz MD has indicated that:    __x_____it is time to schedule an appointment.    _______you missed your appointment on______ and need to call and          reschedule.    _______you need to have lab work done.    _______you need to schedule an appointment discuss lab or test results.    _______you need to call to reschedule your appointment that is                       scheduled on _________.     Please call our office as soon as possible. Our phone number is [phone]. Please press option 1. Our office is open 8a-12noon and 1p-5p, Monday through Friday.     Thank you,    [facility]

## 2011-01-17 NOTE — Assessment & Plan Note (Signed)
Summary: F/UP FOR MED REFILL////SPH   Vital Signs:  Patient profile:   77 year old male Height:      63 inches Weight:      154.13 pounds BMI:     27.40 Pulse rate:   65 / minute Pulse rhythm:   regular BP sitting:   130 / 82  (left arm) Cuff size:   regular  Vitals Entered By: Army Fossa CMA (July 01, 2010 1:57 PM) CC: F/u on Meds, refills. Comments Pt stopped his Simvastatin- states no reaction.   History of Present Illness: ROV s/p a cord stimulator 5-11 for chronic low back pain , pain has decreased somehow  Chronic neck pain -- no better   COPD-- still SOB at times   Hyperlipidemia-- stopped chol med "just run out"  Benign prostatic hypertrophy w/ a nodule, was supposed to see urology 05-2009 but he didn't   Depression-- off welbutrin d/t cost x 4 weeks, mood remains "ok"     Current Medications (verified): 1)  Bupropion Hcl 100 Mg Tabs (Bupropion Hcl) .... Take 1 Tablet By Mouth Three Times A Day 2)  Prilosec Otc 20 Mg Tbec (Omeprazole Magnesium) .... Take 1 Tablet By Mouth As Directed 3)  Spiriva Handihaler 18 Mcg Caps (Tiotropium Bromide Monohydrate) .... Inhale 1 Capsule By Mouth Once A Day 4)  Proventil Hfa 108 (90 Base) Mcg/act Aers (Albuterol Sulfate) .... Inhale 2 Puffs 4 Times Daily As Needed For Cough, Wheezing, or Shortness of Breath. 5)  Sertraline Hcl 50 Mg Tabs (Sertraline Hcl) .Marland Kitchen.. 1 1/2 By Mouth Once Daily - Due Office Visit For Additional Refills 6)  Ambien 10 Mg Tabs (Zolpidem Tartrate) .Marland Kitchen.. 1 By Mouth At Bedtime As Needed 7)  Alprazolam 0.5 Mg  Tb24 (Alprazolam) .... 1/2 To 1 By Mouth Three Times A Day As Needed Anxiety and 1 By Mouth At Bedtime As Needed Insomnia 8)  Aspirin 81 Mg Tbec (Aspirin) .... Take One Tablet By Mouth Daily 9)  Oxycodone-Acetaminophen 5-325 Mg Tabs (Oxycodone-Acetaminophen) .... As Needed  Allergies: 1)  * Sulfa  Past History:  Past Medical History: Chronic neck pain and HA, DX w/ cervical  stenosis GERD COPD Hyperlipidemia Peptic ulcer disease Benign prostatic hypertrophy Depression C-scope 02/2001--2--diverticulosis, internal hemorrhoids, and some hyperplastic folds in his sigmoid area that were benign.    Coronary artery disease, per Cath: mild/non obstructive (03-2006) Osteopenia, per DEXA 2004 (2004)  L ICA 2mm aneurysm (incidental finfing on MRI 9-09, saw neuro) pulmonary nodule, s/p CT x 2, last 05-2008: scarring GB stones per u/s 2007  Past Surgical History: Rotator cuff repair  (L)(2003); (R) 2004 Dr Ranell Patrick Inguinal herniorrhaphy (R) "ulcer surgery" 1978 s/p a cord stimulator placement (lower back)  5-11  Social History: Reviewed history from 07/15/2009 and no changes required. 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 Resp:  mild cough, no sputum or hemoptysis . GI:  Denies abdominal pain, diarrhea, nausea, and vomiting. GU:  Denies dysuria, hematuria, urinary frequency, and urinary hesitancy. Psych:  Denies easily tearful, irritability, and suicidal thoughts/plans.  Physical Exam  General:  alert and well-developed.   Lungs:  normal respiratory effort, no intercostal retractions, no accessory muscle use, and decreased  breath sounds.  Heart:  normal rate, regular rhythm, and no murmur Abdomen:  soft,  non-tender, no distention, no masses, and no guarding.   Rectal:  normal sphincter tone, no masses, no tenderness, and no fissures.  ++ ext hemorrhoids  Prostate:  slight enlarged nontender. Has a firm, 1  cm nodule at the left side which is not  tender    Impression & Recommendations:  Problem # 1:  HYPERLIPIDEMIA (ICD-272.4)  HAS NOT BEEN TAKING SIMVASTATIN LATELY Recommend to restart His updated medication list for this problem includes:     Simvastatin 20 Mg Tabs (Simvastatin) .Marland Kitchen... 1 by mouth at bedtime  Labs Reviewed: SGOT: 85 (12/01/2009)   SGPT: 56 (12/01/2009)   HDL:38.80 (12/01/2009), 63.90 (03/02/2009)  LDL:57 (12/01/2009), 110 (16/09/9603)  Chol:117 (12/01/2009), 244 (03/02/2009)  Trig:104.0 (12/01/2009), 142.0 (03/02/2009)  Problem # 2:  BACK PAIN (ICD-724.5) status post cord stimulator implant, apparently helping His updated medication list for this problem includes:    Aspirin 81 Mg Tbec (Aspirin) .Marland Kitchen... Take one tablet by mouth daily    Oxycodone-acetaminophen 5-325 Mg Tabs (Oxycodone-acetaminophen) .Marland Kitchen... As needed  Problem # 3:  DEPRESSION (ICD-311) in the donut hole, unable to take Wellbutrin for 4 weeks. So far his mood is stable. Recommend to restart Wellbutrin whenever possible His updated medication list for this problem includes:    Bupropion Hcl 100 Mg Tabs (Bupropion hcl) .Marland Kitchen... Take 1 tablet by mouth three times a day    Sertraline Hcl 50 Mg Tabs (Sertraline hcl) .Marland Kitchen... 1 1/2 by mouth once daily - due office visit for additional refills    Alprazolam 0.5 Mg Tb24 (Alprazolam) .Marland Kitchen... 1/2 to 1 by mouth three times a day as needed anxiety and 1 by mouth at bedtime as needed insomnia  Problem # 4:  LIVER FUNCTION TESTS, ABNORMAL (ICD-794.8) labs denies daily intake of ETOH , 1 or 2 drinks (x 5/week) not on simvastatin x a while   Orders: Specimen Handling (54098)  Problem # 5:  NODULAR PROSTATE WITHOUT URINARY OBSTRUCTION (ICD-600.10)  continued to have a left-sided nodule in the prostate He was supposed to see urology for reassessment 05-2009 but he didn't Plan:  re-refer to urology  PSA  Orders: Specimen Handling (11914) Urology Referral (Urology)  Complete Medication List: 1)  Bupropion Hcl 100 Mg Tabs (Bupropion hcl) .... Take 1 tablet by mouth three times a day 2)  Prilosec Otc 20 Mg Tbec (Omeprazole magnesium) .... Take 1 tablet by mouth as directed 3)  Spiriva Handihaler 18 Mcg Caps  (Tiotropium bromide monohydrate) .... Inhale 1 capsule by mouth once a day 4)  Proventil Hfa 108 (90 Base) Mcg/act Aers (Albuterol sulfate) .... Inhale 2 puffs 4 times daily as needed for cough, wheezing, or shortness of breath. 5)  Sertraline Hcl 50 Mg Tabs (Sertraline hcl) .Marland Kitchen.. 1 1/2 by mouth once daily - due office visit for additional refills 6)  Ambien 10 Mg Tabs (Zolpidem tartrate) .Marland Kitchen.. 1 by mouth at bedtime as needed 7)  Alprazolam 0.5 Mg Tb24 (Alprazolam) .... 1/2 to 1 by mouth three times a day as needed anxiety and 1 by mouth at bedtime as needed insomnia 8)  Aspirin 81 Mg Tbec (Aspirin) .... Take one tablet by mouth daily 9)  Oxycodone-acetaminophen 5-325 Mg Tabs (Oxycodone-acetaminophen) .... As needed 10)  Simvastatin 20 Mg Tabs (Simvastatin) .Marland Kitchen.. 1 by mouth at bedtime  Other Orders: Venipuncture ([address]) Prescription Created Electronically [phone])   Patient Instructions: 1)  restart simvastatin, your cholesterol medicine 2)  restart Wellbutrin (bupropion) as soon as you can 3)  Come back in 3  months,  fasting for your physical exam Prescriptions: PROVENTIL HFA 108 (90 BASE) MCG/ACT AERS (ALBUTEROL SULFATE) Inhale 2 puffs 4 times daily as needed for cough, wheezing, or shortness of breath.  #3 month x 4   Entered and Authorized by:   Elita Quick E. Letticia Bhattacharyya MD   Signed by:   Nolon Rod. Alura Olveda MD on 07/01/2010   Method used:   Electronically to        [facility]  [phone]* (retail)       [address] [place], [place]  [address]       Ph: [phone] or [phone]       Fax: [phone]   RxID:   [id number] SPIRIVA HANDIHALER 18 MCG CAPS (TIOTROPIUM BROMIDE MONOHYDRATE) Inhale 1 capsule by mouth once a day  #3 month x 4   Entered and Authorized by:   Elita Quick E. Dejour Vos MD   Signed by:   Nolon Rod. Manuela Halbur MD on 07/01/2010   Method used:   Electronically to        [facility]  [phone]* (retail)       [address] [place], [place]  [address]       Ph: [phone] or  [phone]       Fax: [phone]   RxID:   [id number] SIMVASTATIN 20 MG TABS (SIMVASTATIN) 1 by mouth at bedtime  #90 x 1   Entered and Authorized by:   Nolon Rod. Aalijah Mims MD   Signed by:   Nolon Rod. Javan Gonzaga MD on 07/01/2010   Method used:   Electronically to        [facility]  [phone]* (retail)       [address] [place], [place]  [address]       Ph: [phone] or [phone]       Fax: [phone]   RxID:   [id number]

## 2011-01-17 NOTE — Letter (Signed)
Summary: prostate nodule stable x years  [facility]  [facility]   Imported By: Lanelle Bal 09/06/2010 08:11:20  _____________________________________________________________________  External Attachment:    Type:   Image     Comment:   External Document

## 2011-01-17 NOTE — Progress Notes (Signed)
Summary: Refill Request  Phone Note Refill Request Message from:  [facility] [address] Fax #: [phone]  Refills Requested: Medication #1:  AMBIEN 10 MG TABS 1 by mouth at bedtime as needed   Dosage confirmed as above?Dosage Confirmed   Supply Requested: 1 month   Last Refilled: 01/26/2010 Next Appointment Scheduled: none Initial call taken by: Harold Barban,  March 10, 2010 8:23 AM  Follow-up for Phone Call        #30 WITH 1 REFILL 10/2009 Shary Decamp  March 10, 2010 9:35 AM     Prescriptions: AMBIEN 10 MG TABS (ZOLPIDEM TARTRATE) 1 by mouth at bedtime as needed  #30 x 1   Entered by:   Shary Decamp   Authorized by:   Nolon Rod. Paz MD   Signed by:   Shary Decamp on 03/10/2010   Method used:   Printed then faxed to ...       [facility]  [phone]* (retail)       [address] [place], [place]  [address]       Ph: [phone] or [phone]       Fax: [phone]   RxID:   [id number]

## 2011-01-17 NOTE — Progress Notes (Signed)
Summary: REFILL REQUEST  Phone Note Refill Request Call back at [phone] Message from:  Pharmacy on August 17, 2010 9:59 AM  Refills Requested: Medication #1:  ALPRAZOLAM 0.5 MG  TB24 1/2 to 1 by mouth three times a day as needed anxiety and 1 by mouth at bedtime as needed insomnia   Dosage confirmed as above?Dosage Confirmed   Supply Requested: 1 month   Last Ref[facility]: 07/08/[facility] [address]  Next Appointment Scheduled: NONE Initial call taken by: Lavell Islam,  August 17, 2010 9:59 AM  Follow-up for Phone Call        We gave pt 60 pills on 06/01/10 no refills. Pharm filled on 07/08/10. Army Fossa CMA  August 17, 2010 10:19 AM   Additional Follow-up for Phone Call Additional follow up Details #1::        ok 60 and 3 RF Additional Follow-up by: [facility] E. Hasel Janish MD,  August 17, 2010 3:55 PM    Prescriptions: ALPRAZOLAM 0.5 MG  TB24 (ALPRAZOLAM) 1/2 to 1 by mouth three times a day as needed anxiety and 1 by mouth at bedtime as needed insomnia  #60 x 3   Entered by:   Army Fossa CMA   Authorized by:   Nolon Rod. Deyona Soza MD   Signed by:   Army Fossa CMA on 08/17/2010   Method used:   Printed then faxed to ...       [facility] [facility]  [phone]* (retail)       [address] [place], [place]  [address]       Ph: [phone] or [phone]       Fax: [phone]   RxID:   [id number]

## 2011-01-19 ENCOUNTER — Other Ambulatory Visit: Payer: Self-pay | Admitting: Surgery

## 2011-01-19 NOTE — Op Note (Signed)
Summary: Lumbar & Cervical Epidural Steroid Injection/[facility]  Lumbar & Cervical Epidural Steroid Injection/[facility]   Imported By: Lanelle Bal 12/30/2010 11:27:26  _____________________________________________________________________  External Attachment:    Type:   Image     Comment:   External Document

## 2011-02-02 NOTE — Letter (Signed)
Summary: chronic pain, Rx oxy, f/u 2 months---Orthopaedics  [facility]   Imported By: Lanelle Bal 01/19/2011 09:00:56  _____________________________________________________________________  External Attachment:    Type:   Image     Comment:   External Document

## 2011-02-20 ENCOUNTER — Encounter: Payer: Self-pay | Admitting: Internal Medicine

## 2011-03-07 LAB — BASIC METABOLIC PANEL
BUN: 11 mg/dL (ref 6–23)
CO2: 26 mEq/L (ref 19–32)
Glucose, Bld: 98 mg/dL (ref 70–99)
Potassium: 4.3 mEq/L (ref 3.5–5.1)
Sodium: 141 mEq/L (ref 135–145)

## 2011-03-07 LAB — CBC
HCT: 41.1 % (ref 39.0–52.0)
Hemoglobin: 14.2 g/dL (ref 13.0–17.0)
MCHC: 34.6 g/dL (ref 30.0–36.0)
MCV: 97.5 fL (ref 78.0–100.0)
Platelets: 180 10*3/uL (ref 150–400)
RDW: 14 % (ref 11.5–15.5)

## 2011-03-07 NOTE — Letter (Signed)
Summary: Rx local injection, dr Ethelene Hal---- Orthopaedics  [facility]   Imported By: Maryln Gottron 03/01/2011 14:21:16  _____________________________________________________________________  External Attachment:    Type:   Image     Comment:   External Document

## 2011-03-23 ENCOUNTER — Other Ambulatory Visit: Payer: Self-pay | Admitting: Physical Medicine and Rehabilitation

## 2011-03-23 DIAGNOSIS — M542 Cervicalgia: Secondary | ICD-10-CM

## 2011-03-28 ENCOUNTER — Other Ambulatory Visit: Payer: Self-pay

## 2011-03-28 ENCOUNTER — Ambulatory Visit
Admission: RE | Admit: 2011-03-28 | Discharge: 2011-03-28 | Disposition: A | Discharge status: Home / Self Care | Payer: Medicare Other | Source: Ambulatory Visit | Attending: Physical Medicine and Rehabilitation | Admitting: Physical Medicine and Rehabilitation

## 2011-03-28 DIAGNOSIS — M542 Cervicalgia: Secondary | ICD-10-CM

## 2011-04-05 ENCOUNTER — Encounter: Payer: Self-pay | Admitting: Internal Medicine

## 2011-04-05 ENCOUNTER — Ambulatory Visit (INDEPENDENT_AMBULATORY_CARE_PROVIDER_SITE_OTHER): Payer: Medicare Other | Admitting: Internal Medicine

## 2011-04-05 DIAGNOSIS — Z79899 Other long term (current) drug therapy: Secondary | ICD-10-CM

## 2011-04-05 DIAGNOSIS — J449 Chronic obstructive pulmonary disease, unspecified: Secondary | ICD-10-CM

## 2011-04-05 DIAGNOSIS — M1990 Unspecified osteoarthritis, unspecified site: Secondary | ICD-10-CM

## 2011-04-05 MED ORDER — PREDNISONE 10 MG PO TABS: ORAL_TABLET | ORAL | Status: AC

## 2011-04-05 NOTE — Progress Notes (Signed)
  Subjective:    Patient ID: Dakota [name], male    DOB: [birth year], 77 y.o.   MRN: [id number]  HPI He was seen for the last time several months ago, since then his main concern has been arthritis, he has developed severe generalized arthritis. He is under the care of  Dr.Ramos. He is currently on morphine. The reason for the visit today is hand swelling, sx started around 02-20-2011,  Is right and left, currently is worse on the left. Hands do not itch, they just hurt all over. He was on Celebrex since May 2011 until last week when he discontinued it. He is allergic to sulfa.  Past Medical History  Diagnosis Date  . Chronic neck pain     and HA, dx w/ cervical stenosis  . GERD (gastroesophageal reflux disease)   . COPD (chronic obstructive pulmonary disease)   . Hyperlipidemia   . Peptic ulcer disease   . BPH (benign prostatic hypertrophy)   . Depression   . CAD (coronary artery disease)     per cath: mild/non obstructive 03/2006  . Osteopenia   . 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 Pulmonary nodule     s/p Ct x 2 last 05/2008: scarring  . Gallbladder & bile duct stone     per [place] 2007   Past Surgical History  Procedure Date  . Arthoscopic rotaor cuff repair     L-2003, R-2004 Dr Ranell Patrick  . Inguinal hernia repair     r  . Ulcer surgery 1978  . Spinal cord stimulator insertion 5/11    lower back   History   Social History  . Marital Status: Married    Spouse Name: N/A    Number of Children: 2  . Years of Education: N/A   Occupational History  . 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 Smoking status: Former Games developer  . Smokeless tobacco: Not on file  . Alcohol Use: Yes     denies daily intake, 1 or 2 drinkis (x5/week(  . Drug Use: Not on file  . Sexually Active: Not on file   Other Topics Concern  . Not on file   Social History Narrative   Lives w/ wifedaugther is medical PA     Review of Systems Denies any fevers  or headaches, he has lost some weight. He used to wait 154 pounds and now he weighs 149 pounds. Denies generalized itching Cough and shortness of breath are at baseline. No wheezing.Marland Kitchen No chest pain or hemoptysis No abdominal pain, nausea, vomiting, blood in the stools.    Objective:   Physical Exam Alert oriented. Lungs with decreased breath sounds and few rhonchi and end expiratory wheezes. Cardiovascular regular is in rhythm, no murmur Neck no lymphadenopathies, no swelling, supraclavicular area without mass or nodularity. There is no supraclavicular swelling. Upper extremities: Inspection and palpation of the biceps, triceps and proximal forearm showed no swelling. Both wrists are swollen, left worse than right, there seems to be a tense joint effusion. Both hands are swelling, left worse than right. The L hand has pitting edema in the dorsum, fingers have a tense swelling, no warmness, no redness; cap refill essentially normal.  Similar but less noticeable findings on the right hand. Good to radial pulses bilaterally.  .      Assessment & Plan:

## 2011-04-05 NOTE — Patient Instructions (Signed)
Start taking prednisone Get the  x-rays done  Come back in 2 weeks

## 2011-04-05 NOTE — Assessment & Plan Note (Addendum)
The patient has severe, generalized DJD, he is to follow up by Dr.Ramos. He presents today with bilateral hand swelling, on examination the left side is worse. He seems to have a effusion on both wrists. Vascularly he seems intact.  There is no neck puffines or arm puffiness to think about a DVT. The differential diagnosis for his findings ---->  DJD, rheumatoid arthritis (or other inflammatory arthritis),  Gout, etc.  PLAN:  I will get labs, x-rays, do ASAP referred to rheumatology. From the therapeutic point of view, I think he'll benefit from steroids even if at this point we don't know the exact etiology of his problem. The labs will include sedimentation rate  Addendum: reports CTs done at ortho recently, will get records

## 2011-04-06 ENCOUNTER — Ambulatory Visit (INDEPENDENT_AMBULATORY_CARE_PROVIDER_SITE_OTHER)
Admission: RE | Admit: 2011-04-06 | Discharge: 2011-04-06 | Disposition: A | Discharge status: Home / Self Care | Payer: Medicare Other | Source: Ambulatory Visit | Attending: Internal Medicine | Admitting: Internal Medicine

## 2011-04-06 DIAGNOSIS — M1990 Unspecified osteoarthritis, unspecified site: Secondary | ICD-10-CM

## 2011-04-06 LAB — CBC WITH DIFFERENTIAL/PLATELET
Basophils Relative: 0.5 % (ref 0.0–3.0)
Eosinophils Absolute: 0 10*3/uL (ref 0.0–0.7)
HCT: 43.3 % (ref 39.0–52.0)
Lymphs Abs: 2 10*3/uL (ref 0.7–4.0)
MCHC: 33.1 g/dL (ref 30.0–36.0)
MCV: 97.2 fl (ref 78.0–100.0)
Monocytes Absolute: 4.6 10*3/uL — ABNORMAL HIGH (ref 0.1–1.0)
Neutrophils Relative %: 46.4 % (ref 43.0–77.0)
Platelets: 501 10*3/uL — ABNORMAL HIGH (ref 150.0–400.0)
RBC: 4.45 Mil/uL (ref 4.22–5.81)

## 2011-04-06 LAB — URIC ACID: Uric Acid, Serum: 5.8 mg/dL (ref 4.0–7.8)

## 2011-04-06 LAB — BASIC METABOLIC PANEL
BUN: 18 mg/dL (ref 6–23)
CO2: 25 mEq/L (ref 19–32)
Chloride: 100 mEq/L (ref 96–112)
Creatinine, Ser: 0.8 mg/dL (ref 0.4–1.5)

## 2011-04-06 NOTE — Assessment & Plan Note (Signed)
Having upper extremity swelling. We'll check a chest x-ray

## 2011-04-07 ENCOUNTER — Telehealth: Payer: Self-pay | Admitting: Internal Medicine

## 2011-04-07 NOTE — Telephone Encounter (Signed)
Yes they were.  I always fax everything that has to do with patient's diagnosis, whether it's labs, imaging, etc....  I even review older notes, etc..... And send if related.

## 2011-04-07 NOTE — Telephone Encounter (Signed)
Spoke with the patient, the pain and swelling has decreased after we started prednisone. "It always get better with prednisone". Labs are reviewed, he has a slightly elevated white count and platelet number. Uric acid normal. He has a symmetric inflammatory arthritis and thus the rheumatology consult is indicated. Apparently he will see them by 04/18/2011. I advised the patient to let us know if that doesn't happen.

## 2011-04-07 NOTE — Telephone Encounter (Signed)
[name]- where these faxed w/ referral?

## 2011-04-07 NOTE — Telephone Encounter (Signed)
Please fax all x-rays and blood work to  rheumatology

## 2011-04-07 NOTE — Telephone Encounter (Signed)
Results are back. I need to see how the patient is doing, left message on answering machine

## 2011-04-10 NOTE — Telephone Encounter (Signed)
Thank you :)

## 2011-04-11 ENCOUNTER — Other Ambulatory Visit: Payer: Self-pay | Admitting: Internal Medicine

## 2011-04-11 MED ORDER — ZOLPIDEM TARTRATE 10 MG PO TABS: 10.0000 mg | ORAL_TABLET | Freq: Every evening | ORAL | Status: DC | PRN

## 2011-04-11 NOTE — Telephone Encounter (Signed)
Ok [place], #30, 3 RF No prednisone, needs to see rheumatology

## 2011-04-11 NOTE — Telephone Encounter (Signed)
duplicate

## 2011-04-18 ENCOUNTER — Encounter: Payer: Self-pay | Admitting: Internal Medicine

## 2011-04-19 ENCOUNTER — Ambulatory Visit: Payer: Medicare Other | Admitting: Internal Medicine

## 2011-04-19 ENCOUNTER — Telehealth: Payer: Self-pay | Admitting: Internal Medicine

## 2011-04-19 DIAGNOSIS — Z0289 Encounter for other administrative examinations: Secondary | ICD-10-CM

## 2011-04-19 NOTE — Telephone Encounter (Signed)
Thank you :)

## 2011-04-19 NOTE — Telephone Encounter (Signed)
Please check on the patient, I saw him 2 weeks ago with hand swelling. I referred  him to rheumatology. Has he been seen? Feeling better?

## 2011-04-19 NOTE — Telephone Encounter (Signed)
Message left for patient to return my call.  

## 2011-04-19 NOTE — Telephone Encounter (Signed)
Pt called back and states that he is seeing Rheumatologist at 2:30. He states his symptoms are still the same.

## 2011-04-27 ENCOUNTER — Other Ambulatory Visit: Payer: Self-pay | Admitting: *Deleted

## 2011-04-28 MED ORDER — ALPRAZOLAM 0.5 MG PO TABS: ORAL_TABLET | ORAL | Status: DC

## 2011-04-28 NOTE — Telephone Encounter (Signed)
60, 3 RF 

## 2011-05-05 NOTE — Cardiovascular Report (Signed)
NAMEMAYAN, KLOEPFER                ACCOUNT NO.:  [id number]   MEDICAL RECORD NO.:  [id number]          PATIENT TYPE:  OIB   LOCATION:  NA                           FACILITY:  [facility]   PHYSICIAN:  Arvilla Meres, M.D. LHCDATE OF BIRTH:  07/11/1933   DATE OF PROCEDURE:  04/04/2006  DATE OF DISCHARGE:                              CARDIAC CATHETERIZATION   PRIMARY CARE PHYSICIAN:  Dr. [name]   CARDIOLOGIST:  Dr. Arvilla Meres   PATIENT IDENTIFICATION:  Dakota [name] is a 77 year old male with a history of  presumed COPD with ongoing tobacco use and newly diagnosed hypertension who  has been having about a 1-year history of chest pain.  This happens  approximately several times a month.  He describes it as a tightness in his  chest which is precipitated by anxiety and typically resolves spontaneously  but can last as long as an hour.  There is radiation to his back but no  diaphoresis or dyspnea.  He also has a strong family history of coronary  artery disease.  I saw him in the office and recommended a stress test,  however, he was very anxious to proceed with catheterization and clearly  define his coronary anatomy.  Thus, cardiac catheterization was performed in  the outpatient catheterization lab.   PROCEDURES PERFORMED:  1.  Selective coronary angiography.  2.  Left heart cath.  3.  Left ventriculogram.  4.  Abdominal aortogram.   DESCRIPTION OF PROCEDURE:  Risks and benefits of catheterization were  explained, consent was signed and placed on the chart.  A 4-French arterial  sheath was placed in the right femoral artery using a modified Seldinger  technique.  Standard catheters including preformed JL-4, 3D-RC and angled  pigtail were used for procedure.  All catheter exchanges made over wire.  There were no apparent complications.   Central aortic pressure 143/66 with a mean of 96.  LV pressure was 146/5  with an EDP of 11.  There is no aortic stenosis.   Left main was  short, angiographically normal.  LAD is a long vessel coursing  to the apex, gave off two small diagonals.  There was a 30-40% lesion in the  proximal portion of the LAD.   Left circumflex was a moderate-sized system, gave off a large branching  ramus and trifurcating OM-1.  There was minimal luminal irregularities.   Right coronary artery was a moderate-sized dominant vessel, gave off a large  acute marginal, a small PDA and three small PLs.  There are minimal luminal  irregularities in the acute marginal branch and distal RCA.   Left ventriculogram showed an EF of 60% with evidence of left ventricular  hypertrophy.  There is no mitral regurgitation or wall motion abnormalities.   Abdominal aortogram showed patent renal arteries bilaterally.  There was  mild to moderate abdominal aortic iliac plaquing without any evidence of  aneurysm.   ASSESSMENT:  1.  Minimal nonobstructive coronary artery disease.  2.  Normal left ventricular function with evidence of left ventricular      hypertrophy.  3.  Mild  to moderate abdominal aortic iliac disease.   PLAN/DISCUSSION:  I suspect his chest discomfort is noncardiac.  However, we  will need to continue with aggressive risk factor modification including  control of his blood pressure.  I once again reemphasized the need for  smoking cessation.      Arvilla Meres, M.D. [facility]  Electronically Signed     DB/MEDQ  D:  04/04/2006  T:  04/04/2006  Job:  [id number]   cc:   Wanda Plump, MD [facility]  [phone] [address] [place], [place] [address]

## 2011-05-05 NOTE — Op Note (Signed)
NAME:  Dakota [name], Dakota [name]                          ACCOUNT NO.:  [id number]   MEDICAL RECORD NO.:  [id number]                   PATIENT TYPE:  AMB   LOCATION:  DSC                                  FACILITY:  [facility]   PHYSICIAN:  Almedia Balls. Ranell Patrick, M.D.              DATE OF BIRTH:  1933/01/24   DATE OF PROCEDURE:  11/27/2002  DATE OF DISCHARGE:  11/28/2002                                 OPERATIVE REPORT   PREOPERATIVE DIAGNOSIS:  Right shoulder rotator cuff tear.   POSTOPERATIVE DIAGNOSES:  1. Right shoulder rotator cuff tear.  2. Subscapularis tear.  3. Subacromial spur formation.  4. Acromioclavicular joint arthritis.   PROCEDURES PERFORMED:  1. Right shoulder examination under anesthesia followed by a diagnostic     arthroscopy with extensive intra-articular debridement.  2. Arthroscopic subacromial decompression.  3. Mini open rotator cuff repair followed by open distal clavicle excision.   SURGEON:  Almedia Balls. Ranell Patrick, M.D.   FIRST ASSISTANT:  Bradd Canary. Bain, P.A.-C   ANESTHESIA:  General anesthesia plus interscalene block anesthesia was used.   ESTIMATED BLOOD LOSS:  Minimal.   FLUID REPLACEMENT:  1800 cubic centimeters crystalloid.   INSTRUMENT COUNT:  Correct.   COMPLICATIONS:  None.   PREOPERATIVE ANTIBIOTICS:  __________.   INDICATIONS FOR PROCEDURE:  The patient is a 77 year old gentleman who  presents complaining of severe right shoulder pain.  The patient is noted to  have weakness on external rotation indicative of rotator cuff tear.  MRI  scan demonstrates large retracted rotator cuff tear with AC joint arthritis.  After discussing with the patient the options of management to conservative  management versus surgical repair, the patient elected to proceed with  surgical repair of his rotator cuff.   DESCRIPTION OF PROCEDURE:  After an adequate level of anesthesia was  achieved, the patient was placed in a modified beachchair position.  Examination under  anesthesia was performed demonstrating poor flexion of 130  degrees, abduction 110, external rotation 60, internal rotation 70.  After  completion of the examination under anesthesia, the right shoulder was  prepped and draped in its entirety in the usual sterile fashion.   Diagnostic hemiarthroscopy was performed utilizing standard posterior and  anterior arthroscopic portals after a sterile prep and drape.  These portals  were created in similar fashion with infiltration with standard 0.5%  Marcaine with epinephrine followed by incision with an  #11 blade scalpel and introduction of a cannula in the joint using blunt  obturators.  Diagnostic arthroscopy revealed significant tearing of the  upper portion of subscapularis tendon.  There was additionally noted to be a  full rotator cuff tear involving at least the supraspinatus and some of the  infraspinatus.  There was also noted to be some furring of the labrum.   There was extensive debridement performed arthroscopically using a full  radius resector back to normal healthy subscapularis tissue.  The biceps and  superior labrum were intact.  The terres minor appeared to be intact.  The  hemiarticular cartilage showed minimal chondromalacia.  The anterior  inferior labrum was intact after completion of this.   The shoulder arthroscopic scope was placed in the subacromial space after  bursectomy and formal acromioplasty was performed arthroscopically.  This  yielded a nice smooth undersurface of the acromion which was a type 1  acromion after completion of the acromioplasty.   Attention was paid toward the anterior and lateral spurs which were removed  after completion of the arthroscopic SAD.  A mini open incision was created  facilitating access to the rotator cuff tear.  A small split in the deltoid  was done for this to allow visualization of the rotator cuff tear site.  No  deltoid muscle was taken off the acromion whatsoever.  The  rotator cuff tear  was easily identified.  The tear was freshened up and mobilized and then  repaired back to its anatomical location utilizing a combination of suture  anchors and #2 FiberWire sutures in a modified Mason-Allen suture technique  and the free edge of the tendon was brought out through drill holes in the  bone.  Excellent repair was effected.   A small Saber incision was created overlying the distal clavicle.  Dissection was carried sharply down through the subcutaneous tissues.  The  deltotrapezial fascia was incised in line with the distal clavicle.  Subperiosteal dissection of the clavicle was performed and a 1 cm resection  of the distal clavicle was performed with an oscillating saw.  Bone wax was  applied to the distal clavicle.  Thorough irrigation was performed followed  by closure of the deltotrapezial fascia using #0 Ethibond sutures in a  figure-of-eight buried fashion followed by 2-0 subcutaneous and 4-0 Monocryl  for the skin.  The deltoid was repaired using Vicryl followed by a  subcutaneous closure with 2-0 Vicryl and the skin with 4-0 Monocryl.  Steri-  Strips were applied followed by a sterile dressing.   The patient was placed in a shoulder immobilizer having tolerated surgery  well.                                               Almedia Balls. Ranell Patrick, M.D.    SRN/MEDQ  D:  01/09/2003  T:  01/09/2003  Job:  [id number]

## 2011-05-05 NOTE — Assessment & Plan Note (Signed)
[facility]                         GASTROENTEROLOGY OFFICE NOTE   NAME:Dakota [name], Dakota [name]                       MRN:          [id number]  DATE:11/29/2006                            DOB:          1933-12-05    Dakota [name] diverticulitis seems to have resolved.  He has almost no  tenderness on exam at this time.  His previous CT scan follow up from  November 16, 2006 showed continued diverticulitis without evidence of an  abscess and he has completed his Cipro, metronidazole therapy.  He is  taking over the counter Prilosec, 81 mg of aspirin a day, fluoxetine 60  mg a day, p.r.n. hyoscyamine, and BuSpar 3 times a day.   His lab data showed some mild increase in his transaminase's,  hepatitis  B surface antigen was negative, hepatitis C antibody was negative, alpha  1 antitrypsin level was normal, and ANA was negative.  Serum ferritin  level was normal at 1.7.   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   EXAM:  Today shows a weight of 149 pounds, his blood pressure is 150/70.  Pulse of 68 and regular.  I could not appreciate stigmata chronic liver disease.  His liver did  seem somewhat enlarged in the right upper quadrant with a firm edge but  it was non-nodular.  I could not appreciate splenomegaly, other  abdominal masses, but there was continued slight discomfort left lower  quadrant to deep palpation.  There was no rebound tenderness and bowel  sounds were normal.   ASSESSMENT:  1. Diverticulitis, continue to resolve with medical therapy.  2. Abnormal liver function test and mild hepatomegaly with      questionable etiology.  3. Chronic gastric reflux on over the counter Prilosec.   RECOMMENDATIONS:  1. Repeat liver profile and do upper abdominal ultrasound exam.  2. Finish antibiotic therapy and  then start him on daily benefiber      with continued p.r.n. Levsin use.  3. Follow up in 1 month's time, will probably be needed to schedule      for endoscopy and colonoscopy.     [name], MD, [name], FAGA  Electronically Signed    [name]  DD: 11/29/2006  DT: 11/29/2006  Job #: [id number]   cc:   [name], MD
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
## 2011-05-05 NOTE — Assessment & Plan Note (Signed)
[facility]                         GASTROENTEROLOGY OFFICE NOTE   NAME:[name], Dakota DYKMAN                       MRN:          [id number]  DATE:01/01/2007                            DOB:          22-Apr-1933    Laker's diverticulitis seems to have resolved after prolonged  antibiotic therapy.  He still has some vague, crampy, spasmodic-like  pain in his lower abdomen but is having regular bowel movements.   He had some abnormal liver function tests which resolved spontaneously.  His ultrasound exam December 18 showed multiple small gallstones but the  liver otherwise appeared normal.  There was an incidental right renal  cyst.  He really has no symptoms consistent with recurrent biliary  colic.   His weight today is 150 pounds and blood pressure 142/80.  Pulse was 84  and regular.  Abdominal exam is generally unremarkable.   RECOMMENDATIONS:  1. High-fiber diet with daily Benefiber and continued liberal p.o.      fluids.  2. P.r.n. sublingual Levsin 0.125 mg every 6-8 hours.  3. Followup endoscopy and colonoscopy exam.  This patient has chronic      GERD and has never had endoscopic exam.  He also is status post      partial gastrectomy for nonhealing peptic ulcer disease in 1978.   He is to continue his other medications as per Dr. Drue Novel.     [name], MD, [name], FAGA  Electronically Signed    [name]  DD: 01/01/2007  DT: 01/01/2007  Job #: [id number]   cc:   [name], MD

## 2011-05-05 NOTE — Op Note (Signed)
[facility]  Patient:    DAYLIN, GRUSZKA Visit Number: [id number] MRN: [id number]          Service Type: SUR Location: [id number] [phone] Attending Physician:  Rocky Crafts Dictated by:   Michael Litter. Montez Morita, M.D. Proc. Date: 01/07/02 Admit Date:  01/07/2002 Discharge Date: 01/08/2002                             Operative Report  SURGEON:  Philips J. Montez Morita, M.D.  ASSISTANT:  Marcie Bal. Troncale, P.A.C.  PREOPERATIVE DIAGNOSES:  Rotator cuff tear with impingement and chromic clavicular arthritis.  POSTOPERATIVE DIAGNOSES:  Rotator cuff tear with impingement and chromic clavicular arthritis.  OPERATION PERFORMED:  Distal clavicle excision, rotator cuff repair, anterior acromioplasty.  DESCRIPTION OF PROCEDURE:  After suitable general anesthesia, he was positioned with the shoulder frame and prepped and draped routinely.  An incision is made over the clavicle in the anterior acromion and the deltoid is retracted from the anterior acromion. The distal end of the clavicle is freed up on each side and a saw was used to excise the distal clavicle. Bone wax is placed over the distal clavicle and a small piece of Gelfoam is placed in the area where the meniscus formerly was. An anterior acromioplasty was then created with a saw, very, very thick acromion and beveled. The coracoacromial ligament remnants removed and the deltoid split for about an inch. A large flap of rotator cuff primarily off the supraspinatus was impinged, part of it off of bone. It could easily be repaired down with one drill hole and one suture of #1 Ethibond. Some linear splits were sutured with two interrupted sutures of #1 PDS in the more proximal area so that it would not leave a permanent suture in that part. The rest of the cuff looked good. Two drill holes were placed in the acromion with PDS sutures, #1 to repair the anterior deltoid. The area over the distal clavicle was  approximated with the same with a little piece of Gelfoam in between some 0.5% Marcaine with adrenaline was injected into the tissue except for underneath the skin. Two interrupted sutures in the deltoid fascia itself, some 2-0 in the subcu and a running monocryl, 3-0 in the skin with some Steri-Strips. Abduction pillow split was applied and he goes to recovery in good condition. Dictated by:   [employer]. Montez Morita, M.D. Attending Physician:  Rocky Crafts DD:  01/07/02 TD:  01/08/02 Job: [id number]

## 2011-05-05 NOTE — Op Note (Signed)
NAMEAUGUSTE, TEBBETTS                ACCOUNT NO.:  [id number]   MEDICAL RECORD NO.:  [id number]          PATIENT TYPE:  AMB   LOCATION:  DAY                          FACILITY:  [facility]   PHYSICIAN:  Claudette Laws, M.D.  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      RFS/MEDQ  D:  05/11/2005  T:  05/11/2005  Job:  [id number]

## 2011-05-05 NOTE — Assessment & Plan Note (Signed)
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 He is referred  by Dr. Drue Novel because of abdominal pain and diarrhea.   Dakota [name] has been doing pretty well with his health until 2 weeks ago when he  was at a wedding in [place] and developed acute lower abdominal pain and  diarrhea and was seen in the emergency room there and had a CT scan  which was consistent with diverticulitis. An elevated white count at  that time was 17,200. He was placed on metronidazole and Cipro along  with p.r.n. Levsin and has improved approximately 50% but still  complains of lower abdominal discomfort and diarrhea which is nonbloody  in nature. He has no upper GI or hepatobiliary complaints. He denies  fever, chills, nausea and vomiting, rashes, etc.   I last did his colonoscopy in March of 2002. At that time, he did have  diverticulosis, internal hemorrhoids, and some hyperplastic folds in his  sigmoid area that were benign.   The patient denies abdominal trauma or any history of previous  hepatitis, pancreatitis, or known hepatobiliary problems. He does have  chronic obstructive lung disease and hypertension and has had previous  cardiac catheterization which showed minimal nonobstructive coronary  disease in April of this past year. His left ventricular function was  normal. He did have some mild to moderate abdominal aortic-iliac  atherosclerotic disease. Otherwise he has been in fairly good health  except for a history of anxiety syndrome, degenerative arthritis,  depression, hypercholesterolemia, and sleep apnea. His chart shows a  past history of tobacco and alcohol abuse. He also has had previous  inguinal hernia  surgery.   FAMILY HISTORY:  Remarkable for colon polyps in several family members  and multiple family members with prostate cancer and atherosclerosis.   SOCIAL HISTORY:  The patient is married and lives with his wife. He has  a Naval architect. He continues to smoke heavily and uses ethanol  apparently in moderation.   REVIEW OF SYSTEMS:  Otherwise noncontributory except for chronic  shortness of breath, exertion and decreased exercise tolerance. He does  have diffuse arthralgias, night sweats, and insomnia. He denies any  current cardiovascular or pulmonary complaints otherwise. Review of  systems otherwise noncontributory.   PHYSICAL EXAMINATION:  GENERAL:  He is a nontoxic-appearing white male  in no acute distress.  VITAL SIGNS:  He is 5 feet 2 inches tall and weighs 152 pounds. Blood  pressure is 136/80 and pulse was 80 and regular.  I could not appreciate stigmata of chronic liver disease or icterus.  ABDOMEN:  His abdominal exam showed no distention, hepatosplenomegaly,  abdominal masses, and only minimal tenderness in the left lower quadrant  area.  RECTAL:  Showed no fissures or fistulae. Rectal exam showed no masses or  tenderness, and there was  soft normal colored stool which is guaiac  negative.  EXTREMITIES:  Peripheral extremities were unremarkable.  MENTAL STATUS:  Clear.   ASSESSMENT:  Mr. Knueppel certainly has had diverticulitis and still has  some mild inflammation, I think we need to exclude the formation of an  abscess in this lower abdominal area which is masked by oral  antibiotics.   RECOMMENDATIONS:  1. Check repeat lab data.  2. Check stool exams and C Dif toxin.  3. Repeat abdominal-pelvic CT scan.  4. Continue Cipro 500 mg twice a day and metronidazole 500 mg twice a      day for another 2 weeks.  5. Pamine Forte every 12 h as needed with p.r.n. Darvocet-N 100.  6. GI followup in 1-2 weeks' time or sooner depending on his clinical      course and  workup.     [name], MD, [name], FAGA  Electronically Signed    [name]  DD: 11/16/2006  DT: 11/17/2006  Job #: [id number]   cc:   [name], MD

## 2011-05-05 NOTE — Op Note (Signed)
NAMEGASPAR, FOWLE                ACCOUNT NO.:  [id number]   MEDICAL RECORD NO.:  [id number]          PATIENT TYPE:  AMB   LOCATION:  [facility]                         FACILITY:  [facility]   PHYSICIAN:  Claudette Laws, M.D.  DATE OF BIRTH:  04-13-1933   DATE OF PROCEDURE:  05/04/2005  DATE OF DISCHARGE:                                 OPERATIVE REPORT   PREOPERATIVE DIAGNOSIS:  Multiple scrotal lesions, possibly sebaceous cysts.   POSTOPERATIVE DIAGNOSIS:  Multiple scrotal lesions, possibly sebaceous  cysts.   OPERATION:  Excision of three scrotal lesions.   SURGEON:  Claudette Laws, M.D.   PROCEDURE:  The patient was prepped and draped in the supine position.  Under LMA anesthesia, the scrotum and pubic area were shaved.  He was  prepped and draped in the normal sterile fashion.   Our initial attention was turned to a lesion right at the base of the penis  at the junction of the scrotum and pubic area.  This measured roughly 2 cm  in size.  It was somewhat purulent looking.  We circumscribed the lesion  with a 15 blade knife and then dissected it out in one piece.  We then  submitted this as lesion #1.  I fulgurated the base, and then we closed the  lesion with several 3-0 Vicryl sutures.   The second lesion was a sebaceous-looking lesion right in the midline of the  scrotum measuring approximately 3-4 cm.  A midline incision was made over  the lesion which was then delivered, and it was filled with a cheesy  material.  We dissected it out down to its base, fulgurated surrounding the  skin, and then again closed this lesion with 3-0 Vicryl.   The third lesion was a much smaller, about a 0.5-1 cm lesion on the right  hemiscrotum.  Again, an incision was made right over the top of it.  It was  removed in toto.  We then fulgurated the base and closed the lesion again  with several 3-0 Vicryl sutures.   The skin was prepped with Betadine, and then a dressing was applied with 4 x  4's, a fluff dressing, and a scrotal support.  The patient was then taken  back to the recovery room in satisfactory condition.   All three lesions were sent to pathology.      RFS/MEDQ  D:  05/04/2005  T:  05/04/2005  Job:  [id number]

## 2011-05-05 NOTE — Op Note (Signed)
Dakota [name], Dakota [name]                ACCOUNT NO.:  [id number]   MEDICAL RECORD NO.:  [id number]          PATIENT TYPE:  AMB   LOCATION:  DAY                          FACILITY:  [facility]   PHYSICIAN:  Claudette Laws, M.D.  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   POSTOPERATIVE DIAGNOSIS:  Keratoacanthoma-like squamous cell carcinoma of  the skin at the base of the penis near the pubic area with involvement of a  deep surgical margin.   OPERATION:  Wide excision of squamous cell carcinoma with frozen section.   SURGEON:  Dr. Etta Grandchild   HISTORY:  This is a 77 year old man, who underwent an excision of a lesion  at the base of the penis on May 04, 2005.  The path report came back as a  keratoacanthoma-like squamous cell carcinoma with involvement of the deep  surgical margin.  I saw the patient postop and recommended reexcision with  wide margins.  He understands and came in today as an outpatient.   DESCRIPTION OF PROCEDURE:  The patient was prepped and draped in the supine  position under LMA anesthesia.  Marking pen was used to outline an  elliptical area around the prior area of excision.  Approximately a 2 cm to  3 cm elliptical area was excised with deep margins to the suprapubic fat  area.  The specimen was sent for frozen section.  The frozen section showed  margin negative lesion, no obvious squamous cell cells, and so we then  closed the defect after irrigating it with copious amounts of saline.  I  used a 3-0 chromic for the subcutaneous tissue and then a 4-0 Monocryl  suture for the skin.  Dermabond was used for a dressing, and he was taken  back to the recovery room in satisfactory condition.  I did infiltrate the  area after the excision with 1% plain Marcaine mixed with 1% plain Xylocaine  for anesthetic purposes.      RFS/MEDQ  D:  05/11/2005  T:  05/11/2005  Job:  [id number]

## 2011-06-01 ENCOUNTER — Other Ambulatory Visit: Payer: Self-pay | Admitting: Internal Medicine

## 2011-07-07 ENCOUNTER — Other Ambulatory Visit: Payer: Self-pay | Admitting: Internal Medicine

## 2011-09-05 ENCOUNTER — Encounter: Payer: Self-pay | Admitting: Internal Medicine

## 2011-09-05 ENCOUNTER — Ambulatory Visit (INDEPENDENT_AMBULATORY_CARE_PROVIDER_SITE_OTHER): Payer: Medicare Other | Admitting: Internal Medicine

## 2011-09-05 VITALS — BP 118/80 | Temp 97.5°F | Wt 160.8 lb

## 2011-09-05 DIAGNOSIS — J449 Chronic obstructive pulmonary disease, unspecified: Secondary | ICD-10-CM

## 2011-09-05 LAB — CBC WITH DIFFERENTIAL/PLATELET
Eosinophils Absolute: 0.2 10*3/uL (ref 0.0–0.7)
Eosinophils Relative: 2 % (ref 0–5)
Hemoglobin: 14.1 g/dL (ref 13.0–17.0)
Lymphs Abs: 1.9 10*3/uL (ref 0.7–4.0)
MCH: 32 pg (ref 26.0–34.0)
MCV: 102.5 fL — ABNORMAL HIGH (ref 78.0–100.0)
Monocytes Absolute: 0.7 10*3/uL (ref 0.1–1.0)
Monocytes Relative: 9 % (ref 3–12)
RBC: 4.41 MIL/uL (ref 4.22–5.81)

## 2011-09-05 LAB — TSH: TSH: 6.609 u[IU]/mL — ABNORMAL HIGH (ref 0.350–4.500)

## 2011-09-05 NOTE — Assessment & Plan Note (Addendum)
Long  history of shortness of breath, getting slightly worse according to the patient. Was seen by cardiology in 2010, symptoms not thought to be cardiac in origin. Currently on Spiriva and albuterol, having no cough. Also reportedly taking prednisone as prescribed by rheumatology We'll check a CBC and TSH for completeness.  Add inhaler  Steroids?

## 2011-09-05 NOTE — Progress Notes (Signed)
  Subjective:    Patient ID: Dakota [name], male    DOB: [birth year], 77 y.o.   MRN: [id number]  HPI Ongoing shortness of breath, slowly getting worse despite using his inhalers as prescribed. Also has noticed weight gain, thinks d/t using prednisone as prescribed by rheumatology Anxiety well controlled, needs a refill.  Past Medical History  Diagnosis Date  . Chronic neck pain     and HA, dx w/ cervical stenosis  . GERD (gastroesophageal reflux disease)   . COPD (chronic obstructive pulmonary disease)   . Hyperlipidemia   . Peptic ulcer disease   . BPH (benign prostatic hypertrophy)   . Depression   . CAD (coronary artery disease)     per cath: mild/non obstructive 03/2006  . Osteopenia   . 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 Pulmonary nodule     s/p Ct x 2 last 05/2008: scarring  . Gallbladder & bile duct stone     per [place] 2007   Past Surgical History  Procedure Date  . Arthoscopic rotaor cuff repair     L-2003, R-2004 Dr Ranell Patrick  . Inguinal hernia repair     r  . Ulcer surgery 1978  . Spinal cord stimulator insertion 5/11    lower back    Review of Systems No fever or chills No cough or wheezing No sputum production No chest pain or lower extremity edema Complaining of shortness of breath getting worse when he goes to bed, he however uses only one pillow, no classic orthopnea.    Objective:   Physical Exam  Constitutional: He is oriented to person, place, and time. He appears well-developed and well-nourished.  Neck: No JVD present.  Cardiovascular: Normal rate, regular rhythm and normal heart sounds.   No murmur heard. Pulmonary/Chest:       Decreased breath sounds, very mild end expiratory wheezing without rhonchi, increase work of breathing  Musculoskeletal: He exhibits no edema.  Neurological: He is alert and oriented to person, place, and time.          Assessment & Plan:  Visit took place while the computers were down,   documentation was done after the patient was gone

## 2011-09-07 ENCOUNTER — Other Ambulatory Visit: Payer: Self-pay | Admitting: Internal Medicine

## 2011-09-08 MED ORDER — ALBUTEROL SULFATE HFA 108 (90 BASE) MCG/ACT IN AERS: 2.0000 | INHALATION_SPRAY | Freq: Four times a day (QID) | RESPIRATORY_TRACT | Status: DC | PRN

## 2011-09-08 NOTE — Telephone Encounter (Signed)
Rx sent 

## 2011-09-11 ENCOUNTER — Telehealth: Payer: Self-pay | Admitting: Internal Medicine

## 2011-09-11 NOTE — Telephone Encounter (Signed)
Patient was seen  [phone] (computers were down) he said someonewas to contact him - patient wants to know result of lab -    he wants to know asap

## 2011-09-12 MED ORDER — LEVOTHYROXINE SODIUM 25 MCG PO TABS: 25.0000 ug | ORAL_TABLET | Freq: Every day | ORAL | Status: DC

## 2011-09-12 MED ORDER — BECLOMETHASONE DIPROPIONATE 80 MCG/ACT IN AERS: 2.0000 | INHALATION_SPRAY | Freq: Every day | RESPIRATORY_TRACT | Status: DC

## 2011-09-12 NOTE — Telephone Encounter (Signed)
Patient informed. Spoke w/ OM at his request, upset no one had contacted him. Rx[s] phoned in to pharmacy.

## 2011-09-20 LAB — COMPREHENSIVE METABOLIC PANEL
Alkaline Phosphatase: 63
BUN: 8
Glucose, Bld: 84
Potassium: 4.1
Total Protein: 7.1

## 2011-09-20 LAB — DIFFERENTIAL
Basophils Absolute: 0.1
Basophils Relative: 1
Monocytes Relative: 7
Neutro Abs: 13 — ABNORMAL HIGH
Neutrophils Relative %: 80 — ABNORMAL HIGH

## 2011-09-20 LAB — CBC
HCT: 46.7
Hemoglobin: 15.9
MCHC: 34
MCV: 102.7 — ABNORMAL HIGH
RDW: 13.1

## 2011-09-20 LAB — POCT I-STAT, CHEM 8
BUN: 8
Calcium, Ion: 1.06 — ABNORMAL LOW
Chloride: 107
Creatinine, Ser: 1.1
Glucose, Bld: 79

## 2011-09-20 LAB — URINALYSIS, ROUTINE W REFLEX MICROSCOPIC
Bilirubin Urine: NEGATIVE
Ketones, ur: NEGATIVE
Nitrite: NEGATIVE
Protein, ur: NEGATIVE
Specific Gravity, Urine: 1.012
Urobilinogen, UA: 0.2

## 2011-09-20 LAB — CSF CELL COUNT WITH DIFFERENTIAL
RBC Count, CSF: 173 — ABNORMAL HIGH
RBC Count, CSF: 368 — ABNORMAL HIGH

## 2011-09-20 LAB — PROTEIN AND GLUCOSE, CSF: Glucose, CSF: 55

## 2011-09-20 LAB — PROTIME-INR
INR: 0.9
Prothrombin Time: 12.1

## 2011-09-20 LAB — APTT: aPTT: 33

## 2011-11-01 ENCOUNTER — Other Ambulatory Visit: Payer: Self-pay | Admitting: Internal Medicine

## 2011-11-13 ENCOUNTER — Other Ambulatory Visit: Payer: Self-pay | Admitting: Internal Medicine

## 2011-11-13 MED ORDER — ALPRAZOLAM 0.5 MG PO TABS: ORAL_TABLET | ORAL | Status: DC

## 2011-11-13 NOTE — Telephone Encounter (Signed)
Ok 60, 4 RF 

## 2011-11-13 NOTE — Telephone Encounter (Signed)
Rx phoned in.   

## 2011-11-13 NOTE — Telephone Encounter (Signed)
Last OV 09/05/11. Last refill done 04/27/11

## 2012-02-14 ENCOUNTER — Ambulatory Visit: Payer: Medicare Other | Admitting: Internal Medicine

## 2012-02-20 ENCOUNTER — Ambulatory Visit (INDEPENDENT_AMBULATORY_CARE_PROVIDER_SITE_OTHER): Payer: Medicare Other | Admitting: Internal Medicine

## 2012-02-20 VITALS — BP 138/82 | HR 116 | Temp 97.6°F | Wt 147.0 lb

## 2012-02-20 DIAGNOSIS — R300 Dysuria: Secondary | ICD-10-CM

## 2012-02-20 DIAGNOSIS — F329 Major depressive disorder, single episode, unspecified: Secondary | ICD-10-CM

## 2012-02-20 DIAGNOSIS — E291 Testicular hypofunction: Secondary | ICD-10-CM

## 2012-02-20 DIAGNOSIS — J449 Chronic obstructive pulmonary disease, unspecified: Secondary | ICD-10-CM

## 2012-02-20 DIAGNOSIS — R634 Abnormal weight loss: Secondary | ICD-10-CM | POA: Insufficient documentation

## 2012-02-20 DIAGNOSIS — M353 Polymyalgia rheumatica: Secondary | ICD-10-CM

## 2012-02-20 LAB — COMPREHENSIVE METABOLIC PANEL
ALT: 42 U/L (ref 0–53)
Alkaline Phosphatase: 68 U/L (ref 39–117)
Creatinine, Ser: 1 mg/dL (ref 0.4–1.5)
GFR: 78 mL/min (ref >60.00)
Sodium: 134 mEq/L — ABNORMAL LOW (ref 135–145)
Total Bilirubin: 0.7 mg/dL (ref 0.3–1.2)
Total Protein: 7.5 g/dL (ref 6.0–8.3)

## 2012-02-20 LAB — CBC WITH DIFFERENTIAL/PLATELET
Basophils Absolute: 0 10*3/uL (ref 0.0–0.1)
Basophils Relative: 0.3 % (ref 0.0–3.0)
HCT: 44.4 % (ref 39.0–52.0)
Hemoglobin: 14.8 g/dL (ref 13.0–17.0)
Lymphocytes Relative: 9.1 % — ABNORMAL LOW (ref 12.0–46.0)
Lymphs Abs: 1.2 10*3/uL (ref 0.7–4.0)
Monocytes Relative: 5.9 % (ref 3.0–12.0)
Neutro Abs: 11.2 10*3/uL — ABNORMAL HIGH (ref 1.4–7.7)
RBC: 4.64 Mil/uL (ref 4.22–5.81)
RDW: 12.9 % (ref 11.5–14.6)

## 2012-02-20 LAB — POCT URINALYSIS DIPSTICK
Ketones, UA: NEGATIVE
Nitrite, UA: POSITIVE
Protein, UA: 30

## 2012-02-20 LAB — TSH: TSH: 2.55 u[IU]/mL (ref 0.35–5.50)

## 2012-02-20 MED ORDER — SIMVASTATIN 20 MG PO TABS: 20.0000 mg | ORAL_TABLET | Freq: Every day | ORAL | Status: DC

## 2012-02-20 MED ORDER — ALPRAZOLAM 0.5 MG PO TABS: ORAL_TABLET | ORAL | Status: DC

## 2012-02-20 MED ORDER — SERTRALINE HCL 100 MG PO TABS: 100.0000 mg | ORAL_TABLET | Freq: Every day | ORAL | Status: DC

## 2012-02-20 NOTE — Assessment & Plan Note (Signed)
See above, labs  

## 2012-02-20 NOTE — Assessment & Plan Note (Signed)
See above, labs

## 2012-02-20 NOTE — Assessment & Plan Note (Signed)
Sx apparently well controlled but has wt loss--CXR

## 2012-02-20 NOTE — Assessment & Plan Note (Addendum)
See above, symptoms not well controlled, another physician added Effexor to Zoloft. He self sd/c wellbutrin, took it x years and did not help Plan: Discontinue Effexor, increased dose of Zoloft. Reassess in 2 weeks

## 2012-02-20 NOTE — Patient Instructions (Signed)
Get the  chest x-ray Stop Effexor Sertraline, increase to 100 mg tablets, one tablet daily Come back in 2 weeks

## 2012-02-20 NOTE — Progress Notes (Signed)
Subjective:    Patient ID: Dakota [name], male    DOB: 10-26-1933, 78 y.o.   MRN: [id number]  HPI Acute visit, several issues For the last month has lost weight, and his own scales about 10 pounds. Reports decreased appetite. His joint aches are increasing, he saw Dr.Ramos recently, he was told there is not much else they can do. That got him more depressed, according to the patient he added Effexor to Zoloft. He started Effexor yesterday. On chart review, his weight was 160 in September, today is  147 pounds. Also complains of dysuria and some urinary frequency since yesterday. He also was diagnosed with PMR last year, was on prednisone for a while, he did help for his aches but he couldn't sleep well. Prednisone was eventually discontinued.  Past Medical History  Diagnosis Date  . Chronic neck pain     and HA, dx w/ cervical stenosis  . GERD (gastroesophageal reflux disease)   . COPD (chronic obstructive pulmonary disease)   . Hyperlipidemia   . Peptic ulcer disease   . BPH (benign prostatic hypertrophy)   . Depression   . CAD (coronary artery disease)     per cath: mild/non obstructive 03/2006  . Osteopenia   . 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 Pulmonary nodule     s/p Ct x 2 last 05/2008: scarring  . Gallbladder & bile duct stone     per [place] 2007   Past Surgical History  Procedure Date  . Arthoscopic rotaor cuff repair     L-2003, R-2004 Dr Ranell Patrick  . Inguinal hernia repair     r  . Ulcer surgery 1978  . Spinal cord stimulator insertion 5/11    lower back     Review of Systems His respiratory symptoms are stable, no mucus production, no blood in the sputum, no chest pain or lower extremity edema. He recently had a URI which is getting better, respiratory symptoms were only slight exacerbation at the time. Denies any gross hematuria, mild difficulty urinating. As far as the PMR, he denies headaches, no visual disturbances. He does have some  myalgias. Denies any suicidal ideas.     Objective:   Physical Exam  Constitutional: He appears well-developed. No distress.  HENT:  Head: Normocephalic and atraumatic.  Neck:       Nl carotid pulse   Cardiovascular: Normal rate, regular rhythm and normal heart sounds.   No murmur heard. Pulmonary/Chest:       Decreased BS otherwise normal  Abdominal: Soft. He exhibits no distension. There is no tenderness. There is no rebound and no guarding.       No CVA tenderness   Musculoskeletal: He exhibits no edema.       Hands wrist w/o synovitis changes   Skin: He is not diaphoretic.  Psychiatric:       Not particularly depress today, at baseline           Assessment & Plan:  Here with several issues: --Weight loss --Increase joint pain --History of PMR, saw rheumatology last year, eventually came off prednisone and was discharged from rheumatology. --Increased depression mostly because the pain in the joints and muscles is botherin him. Saw Dr.Ramos recently, he added effexor to Zoloft . --Symptoms consistent with a UTI for 1 day Assessment and plan: Weight loss possibly multifactorial, related to PMR, depression, hypogonadism; he also gain weight while on prednisone and now that he stopped  it  is possible he is going back to baseline. COPD may also be playing a role. Will check a chest x-ray, labs, discontinue Effexor. If a sedimentation rate and CRP are extremely high, he will have to go back on prednisone despite side effects, and will be referred to rheumatology again.

## 2012-02-21 LAB — URINALYSIS, MICROSCOPIC ONLY
Squamous Epithelial / LPF: NONE SEEN
WBC, UA: >50 WBC/hpf — AB (ref <3)

## 2012-02-21 LAB — TESTOSTERONE, FREE, TOTAL, SHBG
Sex Hormone Binding: 56 nmol/L (ref 13–71)
Testosterone, Free: 13.5 pg/mL — ABNORMAL LOW (ref 47.0–244.0)
Testosterone-% Free: 1.3 % — ABNORMAL LOW (ref 1.6–2.9)
Testosterone: 104.28 ng/dL — ABNORMAL LOW (ref 250–890)

## 2012-02-21 MED ORDER — CIPROFLOXACIN HCL 500 MG PO TABS: 500.0000 mg | ORAL_TABLET | Freq: Two times a day (BID) | ORAL | Status: AC

## 2012-02-22 ENCOUNTER — Encounter: Payer: Self-pay | Admitting: Internal Medicine

## 2012-02-22 ENCOUNTER — Telehealth: Payer: Self-pay | Admitting: Internal Medicine

## 2012-02-22 MED ORDER — PREDNISONE 10 MG PO TABS: 10.0000 mg | ORAL_TABLET | Freq: Every day | ORAL | Status: DC

## 2012-02-22 NOTE — Telephone Encounter (Signed)
Advise patient, I did discuss his results with Dr. Dierdre Forth, we agreed that some of his symptoms may be from PMR. Plan: Start prednisone 10 mg one tablet daily, #30, no refills. Followup with me as planned in approximately 2 weeks. Dr. Dierdre Forth  office will call him and set up an appointment. Also recommend to have his chest x-ray done as rx

## 2012-02-22 NOTE — Telephone Encounter (Signed)
Spoke with pt, gave results, & sent in prescriptions.

## 2012-02-23 LAB — URINE CULTURE: Colony Count: >=100000

## 2012-02-26 ENCOUNTER — Telehealth: Payer: Self-pay | Admitting: Internal Medicine

## 2012-02-26 NOTE — Telephone Encounter (Signed)
Called to check on patient, on abx, urinary sx decreasing, overall feels better. Has not started prednisone yet but plans to do so today. He has a very low testosterone, I'm planning to send him to endocrinology when he RTC  as this problem may be playing a role on his wt loss

## 2012-03-06 ENCOUNTER — Ambulatory Visit: Payer: Medicare Other | Admitting: Internal Medicine

## 2012-05-03 ENCOUNTER — Other Ambulatory Visit: Payer: Self-pay | Admitting: Physical Medicine and Rehabilitation

## 2012-05-03 DIAGNOSIS — H539 Unspecified visual disturbance: Secondary | ICD-10-CM

## 2012-05-03 DIAGNOSIS — R51 Headache: Secondary | ICD-10-CM

## 2012-05-07 ENCOUNTER — Ambulatory Visit
Admission: RE | Admit: 2012-05-07 | Discharge: 2012-05-07 | Disposition: A | Discharge status: Home / Self Care | Payer: Medicare Other | Source: Ambulatory Visit | Attending: Physical Medicine and Rehabilitation | Admitting: Physical Medicine and Rehabilitation

## 2012-05-07 DIAGNOSIS — R51 Headache: Secondary | ICD-10-CM

## 2012-05-07 DIAGNOSIS — H539 Unspecified visual disturbance: Secondary | ICD-10-CM

## 2012-06-05 ENCOUNTER — Other Ambulatory Visit: Payer: Self-pay | Admitting: Diagnostic Neuroimaging

## 2012-06-05 ENCOUNTER — Other Ambulatory Visit: Payer: Self-pay | Admitting: Internal Medicine

## 2012-06-05 DIAGNOSIS — R269 Unspecified abnormalities of gait and mobility: Secondary | ICD-10-CM

## 2012-06-05 DIAGNOSIS — R292 Abnormal reflex: Secondary | ICD-10-CM

## 2012-06-05 NOTE — Telephone Encounter (Signed)
Refill done.  

## 2012-06-06 ENCOUNTER — Ambulatory Visit
Admission: RE | Admit: 2012-06-06 | Discharge: 2012-06-06 | Disposition: A | Discharge status: Home / Self Care | Payer: Medicare Other | Source: Ambulatory Visit | Attending: Diagnostic Neuroimaging | Admitting: Diagnostic Neuroimaging

## 2012-06-06 DIAGNOSIS — R292 Abnormal reflex: Secondary | ICD-10-CM

## 2012-06-06 DIAGNOSIS — R269 Unspecified abnormalities of gait and mobility: Secondary | ICD-10-CM

## 2012-06-12 ENCOUNTER — Ambulatory Visit: Payer: Medicare Other | Attending: Diagnostic Neuroimaging | Admitting: Physical Therapy

## 2012-06-12 DIAGNOSIS — M6281 Muscle weakness (generalized): Secondary | ICD-10-CM | POA: Insufficient documentation

## 2012-06-12 DIAGNOSIS — IMO0001 Reserved for inherently not codable concepts without codable children: Secondary | ICD-10-CM | POA: Insufficient documentation

## 2012-06-12 DIAGNOSIS — R269 Unspecified abnormalities of gait and mobility: Secondary | ICD-10-CM | POA: Insufficient documentation

## 2012-06-17 ENCOUNTER — Telehealth: Payer: Self-pay | Admitting: Internal Medicine

## 2012-06-17 NOTE — Telephone Encounter (Signed)
Neurology did labs few days ago: TSH 7.0 B12 normal A1c 5.6 Advise patient: Increase Synthroid 25 mcg 1 by mouth daily to 2 tablets daily. If he has not been taking Synthroid 25 mcg 1 a day, let me know Office visit here in 6 weeks

## 2012-06-18 MED ORDER — LEVOTHYROXINE SODIUM 25 MCG PO TABS: 25.0000 ug | ORAL_TABLET | Freq: Two times a day (BID) | ORAL | Status: DC

## 2012-06-18 NOTE — Telephone Encounter (Signed)
Discussed with pt, sent in rx.  

## 2012-06-19 ENCOUNTER — Ambulatory Visit: Payer: Medicare Other | Attending: Diagnostic Neuroimaging | Admitting: Physical Therapy

## 2012-06-19 DIAGNOSIS — R269 Unspecified abnormalities of gait and mobility: Secondary | ICD-10-CM | POA: Insufficient documentation

## 2012-06-19 DIAGNOSIS — IMO0001 Reserved for inherently not codable concepts without codable children: Secondary | ICD-10-CM | POA: Insufficient documentation

## 2012-06-19 DIAGNOSIS — M6281 Muscle weakness (generalized): Secondary | ICD-10-CM | POA: Insufficient documentation

## 2012-06-26 ENCOUNTER — Ambulatory Visit: Payer: Medicare Other | Admitting: Physical Therapy

## 2012-06-28 ENCOUNTER — Encounter: Payer: Medicare Other | Admitting: Physical Therapy

## 2012-07-01 ENCOUNTER — Ambulatory Visit: Payer: Medicare Other | Admitting: Physical Therapy

## 2012-07-04 ENCOUNTER — Ambulatory Visit: Payer: Medicare Other | Admitting: Physical Therapy

## 2012-07-05 ENCOUNTER — Ambulatory Visit (INDEPENDENT_AMBULATORY_CARE_PROVIDER_SITE_OTHER): Payer: Medicare Other | Admitting: Internal Medicine

## 2012-07-05 ENCOUNTER — Telehealth: Payer: Self-pay | Admitting: Internal Medicine

## 2012-07-05 VITALS — BP 136/82 | HR 65 | Temp 97.9°F | Wt 143.0 lb

## 2012-07-05 DIAGNOSIS — E291 Testicular hypofunction: Secondary | ICD-10-CM

## 2012-07-05 DIAGNOSIS — F329 Major depressive disorder, single episode, unspecified: Secondary | ICD-10-CM

## 2012-07-05 DIAGNOSIS — R634 Abnormal weight loss: Secondary | ICD-10-CM

## 2012-07-05 DIAGNOSIS — M353 Polymyalgia rheumatica: Secondary | ICD-10-CM

## 2012-07-05 DIAGNOSIS — H9190 Unspecified hearing loss, unspecified ear: Secondary | ICD-10-CM

## 2012-07-05 DIAGNOSIS — E039 Hypothyroidism, unspecified: Secondary | ICD-10-CM

## 2012-07-05 LAB — SEDIMENTATION RATE: Sed Rate: 9 mm/hr (ref 0–22)

## 2012-07-05 LAB — C-REACTIVE PROTEIN: CRP: <1 mg/dL (ref 1–20)

## 2012-07-05 LAB — CBC WITH DIFFERENTIAL/PLATELET
Basophils Absolute: 0 10*3/uL (ref 0.0–0.1)
Eosinophils Absolute: 0.1 10*3/uL (ref 0.0–0.7)
Lymphocytes Relative: 25.8 % (ref 12.0–46.0)
MCHC: 33.2 g/dL (ref 30.0–36.0)
Monocytes Relative: 10.5 % (ref 3.0–12.0)
Neutrophils Relative %: 61.2 % (ref 43.0–77.0)
Platelets: 313 10*3/uL (ref 150.0–400.0)
RDW: 14.6 % (ref 11.5–14.6)

## 2012-07-05 NOTE — Progress Notes (Signed)
  Subjective:    Patient ID: Dakota [name], male    DOB: 09-02-1933, 78 y.o.   MRN: [id number]  HPI Followup The patient continue with weight loss, has decreased 4 pounds since 02-2012. He also continue with night sweats. As far as day PMR, he is taking prednisone, states that is causing some insomnia. He did not see rheumatology. As far as depression, he self discontinue bupropion, he is taking Effexor and Xanax. Continue with headaches, saw neurology, see assessment and plan  Past Medical History  Diagnosis Date  . Chronic neck pain     and HA, dx w/ cervical stenosis  . GERD (gastroesophageal reflux disease)   . COPD (chronic obstructive pulmonary disease)   . Hyperlipidemia   . Peptic ulcer disease   . BPH (benign prostatic hypertrophy)   . Depression   . CAD (coronary artery disease)     per cath: mild/non obstructive 03/2006  . Osteopenia   . 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 Pulmonary nodule     s/p Ct x 2 last 05/2008: scarring  . Gallbladder & bile duct stone     per [place] 2007     Review of Systems Status post a UTI, was treated with Cipro 02-2012, denies dysuria or gross hematuria at the present time. Also denies cough, chest congestion, shortness of breath. No nausea, vomiting, diarrhea or blood in the stools. Denies nasal congestion, nasal discharge.    Objective:   Physical Exam Alert, oriented x3, no apparent distress. He has dropped 4 pounds in the last 4 months. Neck no thyromegaly, no lymphadenopathy, no supraclavicular mass Lungs: Decreased breath sounds bilaterally Cardiovascular, regular rate and rhythm without a murmur Abdomen, soft, nontender. Digital rectal exam without mass, brown stools, has a 0.8 cm left-sided prostate nodule, not tender. Otherwise the gland is small Lower extremities no edema       Assessment & Plan:

## 2012-07-05 NOTE — Assessment & Plan Note (Signed)
Weight loss, night sweats:  Etiology unclear, no obvious source of infection; recent CT showed sinus disease but the patient is asymptomatic  Prostate is not enlarged or tender, he does have a nodule which is not a new finding  He had a recent UTI, unable to provide a sample today  Symptoms could be related to hypogonadism, occult infection or malignancy and PMR  Recent B12 and A1c normal at neurology  Plan:  Endocrinology referral reference hypogonadism  Chest x-ray, CBC

## 2012-07-05 NOTE — Assessment & Plan Note (Signed)
PMR,  Continue with severe headaches, weight loss, joint and muscle aches.  He is careful followup by Dr.Ramos.  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  Plan:  Recheck sedimentation rate and CRP.  He is taking a low dose of prednisone, higher doses usually cause side effects  Refer to Dr. Dierdre Forth

## 2012-07-05 NOTE — Assessment & Plan Note (Signed)
refer to endocrinology for possible treatment, hypogonadism ? playing a role in weight loss and night sweats. ? treatment

## 2012-07-05 NOTE — Assessment & Plan Note (Signed)
Depression, self d/c Bupropion

## 2012-07-05 NOTE — Telephone Encounter (Signed)
Does pt need a referral? Please advise.

## 2012-07-05 NOTE — Patient Instructions (Addendum)
Please get your x-ray at the other [facility]  office located at: [address] [place], across from [facility].  Please go to the basement, this is a walk-in facility, they are open from 8:30 to 5:30 PM. Phone number [phone]. ------------------ Come back in one month for a recheck

## 2012-07-05 NOTE — Assessment & Plan Note (Signed)
recent TSH at neurology 7.0, Synthroid was increased. Good compliance, recheck in one month

## 2012-07-08 NOTE — Telephone Encounter (Signed)
Referral entered  

## 2012-07-08 NOTE — Telephone Encounter (Signed)
Ok to arrange.

## 2012-07-09 ENCOUNTER — Encounter: Payer: Medicare Other | Admitting: Physical Therapy

## 2012-07-09 ENCOUNTER — Ambulatory Visit (INDEPENDENT_AMBULATORY_CARE_PROVIDER_SITE_OTHER)
Admission: RE | Admit: 2012-07-09 | Discharge: 2012-07-09 | Disposition: A | Discharge status: Home / Self Care | Payer: Medicare Other | Source: Ambulatory Visit | Attending: Internal Medicine | Admitting: Internal Medicine

## 2012-07-09 DIAGNOSIS — R634 Abnormal weight loss: Secondary | ICD-10-CM

## 2012-07-11 ENCOUNTER — Ambulatory Visit: Payer: Medicare Other | Admitting: Physical Therapy

## 2012-07-12 ENCOUNTER — Encounter: Payer: Self-pay | Admitting: *Deleted

## 2012-07-16 ENCOUNTER — Telehealth: Payer: Self-pay | Admitting: *Deleted

## 2012-07-16 ENCOUNTER — Encounter: Payer: Medicare Other | Admitting: Physical Therapy

## 2012-07-16 NOTE — Telephone Encounter (Signed)
Pt left VM that he fell and has a area that he can not seem to get heal. Pt would like to know what he needs to do. Left message to call office.

## 2012-07-17 ENCOUNTER — Telehealth: Payer: Self-pay | Admitting: Internal Medicine

## 2012-07-17 NOTE — Telephone Encounter (Signed)
Caller: Dakota [name]/Patient; PCP: [name]; CB#: [phone]; Call regarding Cuts, Scrapes, Abrasions; Pt scraped his R knee during a fall on 07/05/12. Knee hasn't healed. Redness with pain. Advised see in 24 hrs per Scrapes Protocol. Neosporin bid. Pt cannot come in till 07/19/12. Appt sched for 1130 with Dr. Drue Novel.

## 2012-07-17 NOTE — Telephone Encounter (Signed)
See CAN message

## 2012-07-17 NOTE — Telephone Encounter (Signed)
noted 

## 2012-07-18 ENCOUNTER — Ambulatory Visit: Payer: Medicare Other | Attending: Diagnostic Neuroimaging | Admitting: Physical Therapy

## 2012-07-18 DIAGNOSIS — M6281 Muscle weakness (generalized): Secondary | ICD-10-CM | POA: Insufficient documentation

## 2012-07-18 DIAGNOSIS — R269 Unspecified abnormalities of gait and mobility: Secondary | ICD-10-CM | POA: Insufficient documentation

## 2012-07-18 DIAGNOSIS — IMO0001 Reserved for inherently not codable concepts without codable children: Secondary | ICD-10-CM | POA: Insufficient documentation

## 2012-07-19 ENCOUNTER — Encounter: Payer: Self-pay | Admitting: Internal Medicine

## 2012-07-19 ENCOUNTER — Ambulatory Visit (INDEPENDENT_AMBULATORY_CARE_PROVIDER_SITE_OTHER): Payer: Medicare Other | Admitting: Internal Medicine

## 2012-07-19 VITALS — BP 130/80 | HR 73 | Temp 97.9°F | Wt 144.0 lb

## 2012-07-19 DIAGNOSIS — M353 Polymyalgia rheumatica: Secondary | ICD-10-CM

## 2012-07-19 DIAGNOSIS — S81809A Unspecified open wound, unspecified lower leg, initial encounter: Secondary | ICD-10-CM

## 2012-07-19 DIAGNOSIS — S81009A Unspecified open wound, unspecified knee, initial encounter: Secondary | ICD-10-CM

## 2012-07-19 MED ORDER — ALPRAZOLAM 0.5 MG PO TABS: ORAL_TABLET | ORAL | Status: DC

## 2012-07-19 MED ORDER — ZOLPIDEM TARTRATE 10 MG PO TABS: 10.0000 mg | ORAL_TABLET | Freq: Every evening | ORAL | Status: DC | PRN

## 2012-07-19 MED ORDER — DOXYCYCLINE HYCLATE 100 MG PO TABS: 100.0000 mg | ORAL_TABLET | Freq: Two times a day (BID) | ORAL | Status: AC

## 2012-07-19 NOTE — Assessment & Plan Note (Signed)
Slow healing excoriation of the knee, will prescribe doxycycline as he reportedly has seen some pus. Also recommend to keep the area clean and dry, let the shower run on the knee to clear debris. Call if not better

## 2012-07-19 NOTE — Assessment & Plan Note (Signed)
Reportedly saw rheumatology recently, prednisone dose was increased, feeling better

## 2012-07-19 NOTE — Progress Notes (Signed)
  Subjective:    Patient ID: Dakota [name], male    DOB: 06/04/1933, 78 y.o.   MRN: [id number]  HPI Acute visit 3 weeks ago, he fell at home, injured his forehead, nose and right knee. EMS check on him, he did not need to go to the hospital. forehead and nose are okay, he still has a wound in the right knee that is not healing.  Also, he reports that saw rheumatology, prednisone dose has been increased and he feels better. As far as the dizziness, reports he saw neurology, no clear etiology as off why he is dizzy. Currently doing physical therapy.  Past Medical History  Diagnosis Date  . Chronic neck pain     and HA, dx w/ cervical stenosis  . GERD (gastroesophageal reflux disease)   . COPD (chronic obstructive pulmonary disease)   . Hyperlipidemia   . Peptic ulcer disease   . BPH (benign prostatic hypertrophy)   . Depression   . CAD (coronary artery disease)     per cath: mild/non obstructive 03/2006  . Osteopenia   . 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 Pulmonary nodule     s/p Ct x 2 last 05/2008: scarring  . Gallbladder & bile duct stone     per [place] 2007    Review of Systems No fever or chills Complained of pain at the site of the wound, knee itself is not tender and full range of motion. He has seen some d/c     Objective:   Physical Exam  Constitutional: No distress.  Musculoskeletal:       Right knee itself seems normal, no effusion, range of motion is normal  Skin: He is not diaphoretic.             Assessment & Plan:

## 2012-07-19 NOTE — Patient Instructions (Addendum)
Keep the area clean and dry, cover with gause Shower every day, clean the area with soap Take the antibiotic as prescribed and one week, call if not improving.

## 2012-07-23 ENCOUNTER — Other Ambulatory Visit (INDEPENDENT_AMBULATORY_CARE_PROVIDER_SITE_OTHER): Payer: Medicare Other

## 2012-07-23 ENCOUNTER — Encounter: Payer: Self-pay | Admitting: Endocrinology

## 2012-07-23 ENCOUNTER — Ambulatory Visit (INDEPENDENT_AMBULATORY_CARE_PROVIDER_SITE_OTHER): Payer: Medicare Other | Admitting: Endocrinology

## 2012-07-23 VITALS — BP 134/68 | HR 62 | Temp 97.3°F | Wt 147.0 lb

## 2012-07-23 DIAGNOSIS — N400 Enlarged prostate without lower urinary tract symptoms: Secondary | ICD-10-CM

## 2012-07-23 DIAGNOSIS — E291 Testicular hypofunction: Secondary | ICD-10-CM

## 2012-07-23 LAB — PSA: PSA: 0.62 ng/mL (ref 0.10–4.00)

## 2012-07-23 NOTE — Progress Notes (Signed)
Subjective:    Patient ID: Dakota [name], male    DOB: 13-Nov-1933, 78 y.o.   MRN: [id number]  HPI Pt states few mos of slightly decreased muscle strength, throughout the body, and assoc weight loss.   Past Medical History  Diagnosis Date  . Chronic neck pain     and HA, dx w/ cervical stenosis  . GERD (gastroesophageal reflux disease)   . COPD (chronic obstructive pulmonary disease)   . Hyperlipidemia   . Peptic ulcer disease   . BPH (benign prostatic hypertrophy)   . Depression   . CAD (coronary artery disease)     per cath: mild/non obstructive 03/2006  . Osteopenia   . 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 Pulmonary nodule     s/p Ct x 2 last 05/2008: scarring  . Gallbladder & bile duct stone     per [place] 2007    Past Surgical History  Procedure Date  . Arthoscopic rotaor cuff repair     L-2003, R-2004 Dr Ranell Patrick  . Inguinal hernia repair     r  . Ulcer surgery 1978  . Spinal cord stimulator insertion 5/11    lower back    History   Social History  . Marital Status: Married    Spouse Name: N/A    Number of Children: 2  . Years of Education: N/A   Occupational History  . 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 Smoking status: Former Games developer  . Smokeless tobacco: Never Used  . Alcohol Use: Yes     denies daily intake, 1 or 2 drinkis (x5/week(  . Drug Use: Not on file  . Sexually Active: Not on file   Other Topics Concern  . Not on file   Social History Narrative   Lives w/ wifedaugther is medical PA    Current Outpatient Prescriptions on File Prior to Visit  Medication Sig Dispense Refill  . albuterol (PROVENTIL HFA) 108 (90 BASE) MCG/ACT inhaler Inhale 2 puffs into the lungs every 6 (six) hours as needed.  1 Inhaler  2  . ALPRAZolam (XANAX) 0.5 MG tablet 1/2-1 by mouth three times a day as needed. 1 tab at bedtime as needed for insomnia.  60 tablet  0  . aspirin 81 MG tablet Take 81 mg by mouth daily.        .  beclomethasone (QVAR) 80 MCG/ACT inhaler Inhale 2 puffs into the lungs daily.  3 Inhaler  3  . cyclobenzaprine (FLEXERIL) 5 MG tablet Take 5 mg by mouth daily.      Marland Kitchen doxycycline (VIBRA-TABS) 100 MG tablet Take 1 tablet (100 mg total) by mouth 2 (two) times daily.  14 tablet  0  . HYDROmorphone (DILAUDID) 8 MG tablet Take 8 mg by mouth every 6 (six) hours as needed.      Marland Kitchen levothyroxine (LEVOTHROID) 25 MCG tablet Take 1 tablet (25 mcg total) by mouth 2 (two) times daily.  180 tablet  0  . omeprazole (PRILOSEC OTC) 20 MG tablet Take 20 mg by mouth daily.        . predniSONE (DELTASONE) 10 MG tablet Take 1 tablet (10 mg total) by mouth daily.  30 tablet  0  . sertraline (ZOLOFT) 100 MG tablet Take 1 tablet (100 mg total) by mouth daily.  30 tablet  3  . simvastatin (ZOCOR) 20 MG tablet Take 1 tablet (20  mg total) by mouth at bedtime.  30 tablet  3  . SPIRIVA HANDIHALER 18 MCG inhalation capsule INHALE 1 CAPSULE VIA HANDIHALER ONCE DAILY AT THE SAME TIME EACH DAY  30 capsule  0  . venlafaxine XR (EFFEXOR-XR) 75 MG 24 hr capsule Take 75 mg by mouth daily.      Marland Kitchen zolpidem (AMBIEN) 10 MG tablet Take 1 tablet (10 mg total) by mouth at bedtime as needed.  30 tablet  4    Allergies  Allergen Reactions  . Sulfonamide Derivatives     REACTION: unspecified    Family History  Problem Relation Age of Onset  . Heart attack Neg Hx   . Diabetes Neg Hx   . Colon cancer Neg Hx   . Prostate cancer Father   . Prostate cancer Brother     There were no vitals taken for this visit.    Review of Systems denies polyuria, decreased urinary stream, gynecomastia, fever, easy bruising, sob, rash, blurry vision, rhinorrhea, chest pain.  He has chronic arthralgias, night sweats, numbness of the arms and legs, erectile dysfunction, headaches, leg cramps, depression, and unsteadiness of gait.      Objective:   Physical Exam VS: see vs page GEN: no distress HEAD: head: no deformity eyes: no periorbital  swelling, no proptosis external nose and ears are normal mouth: no lesion seen NECK: supple, thyroid is not enlarged CHEST WALL: no deformity LUNGS: clear to auscultation BREASTS:  No gynecomastia CV: reg rate and rhythm, no murmur ABD: abdomen is soft, nontender.  no hepatosplenomegaly.  not distended.  no hernia GENITALIA:  Normal male.   MUSCULOSKELETAL: muscle bulk and strength are grossly normal.  no obvious joint swelling.  gait is normal and steady EXTEMITIES: no deformity.  no ulcer on the feet.  feet are of normal color and temp.  no edema PULSES: dorsalis pedis intact bilat.  no carotid bruit NEURO:  cn 2-12 grossly intact.   readily moves all 4's.  sensation is intact to touch on the feet SKIN:  Normal texture and temperature.  No rash or suspicious lesion is visible.  Normal hair distribution.  Few abrasions on the knees and forehead. NODES:  None palpable at the neck PSYCH: alert, oriented x3.  Does not appear anxious nor depressed.   Lab Results  Component Value Date   TESTOSTERONE 196.25* 07/23/2012   Prolactin=19    Assessment & Plan:  Hyperprolactinemia, new, uncertain etiology Hypogonadism.  Uncertain if this is related to elev prolactin H/o chronic headache.  i'll address possible need for head ct at next ov Muscle weakness.  Uncertain if this is related to hypogonadism.

## 2012-07-23 NOTE — Patient Instructions (Addendum)
blood tests are being requested for you today.  You will receive a letter with results. normalization of testosterone is not known to harm you.  however, there are "theoretical" risks, including increased fertility, hair loss, prostate cancer, benign prostate enlargement, blood clots, liver problems, lower hdl ("good cholesterol"), sleep apnea, and behavior changes.

## 2012-07-24 ENCOUNTER — Encounter: Payer: Self-pay | Admitting: Endocrinology

## 2012-07-24 LAB — FOLLICLE STIMULATING HORMONE: FSH: 14 m[IU]/mL (ref 1.4–18.1)

## 2012-07-24 LAB — LUTEINIZING HORMONE: LH: 5.54 m[IU]/mL (ref 3.10–34.60)

## 2012-07-24 MED ORDER — BROMOCRIPTINE MESYLATE 2.5 MG PO TABS: 2.5000 mg | ORAL_TABLET | Freq: Every day | ORAL | Status: DC

## 2012-07-25 ENCOUNTER — Ambulatory Visit: Payer: Medicare Other | Admitting: Physical Therapy

## 2012-07-26 ENCOUNTER — Telehealth: Payer: Self-pay | Admitting: Endocrinology

## 2012-07-26 ENCOUNTER — Telehealth: Payer: Self-pay | Admitting: *Deleted

## 2012-07-26 NOTE — Telephone Encounter (Signed)
Pt states that he cannot remember what medication he needed refill, pt informed to contact pharmacy for future refills.

## 2012-07-26 NOTE — Telephone Encounter (Signed)
Left message for pt to callback office.  

## 2012-07-26 NOTE — Telephone Encounter (Signed)
The pt called the triage line and is need of a refill , but did not state which medication he needed.  I called him and left him a voice mail to clarify which medication he is in need of.   Thanks!

## 2012-07-26 NOTE — Telephone Encounter (Signed)
Called pt to inform of lab results, left message for pt to callback office (letter also mailed to pt). 

## 2012-07-26 NOTE — Telephone Encounter (Signed)
Pt informed of lab results and of new rx for Bromocriptine.

## 2012-08-01 ENCOUNTER — Ambulatory Visit: Payer: Medicare Other | Admitting: Physical Therapy

## 2012-08-02 ENCOUNTER — Ambulatory Visit: Payer: Medicare Other | Admitting: Physical Therapy

## 2012-08-06 ENCOUNTER — Ambulatory Visit: Payer: Medicare Other | Admitting: Physical Therapy

## 2012-08-09 ENCOUNTER — Encounter: Payer: Medicare Other | Admitting: Physical Therapy

## 2012-08-12 ENCOUNTER — Ambulatory Visit: Payer: Medicare Other | Admitting: Physical Therapy

## 2012-08-12 ENCOUNTER — Ambulatory Visit: Payer: Medicare Other | Admitting: Internal Medicine

## 2012-08-14 ENCOUNTER — Telehealth: Payer: Self-pay | Admitting: Internal Medicine

## 2012-08-14 MED ORDER — ALPRAZOLAM 0.5 MG PO TABS: ORAL_TABLET | ORAL | Status: DC

## 2012-08-14 NOTE — Telephone Encounter (Signed)
Caller: [name]/Patient; Patient Name: Dakota [name]; PCP: [name]; Best Callback Phone Number: [phone]  Patient states his brother was admitted to Hospice. Patient states he has had increased anxiety related to his Brother's illness. Patient states he has a good support system. Patient states he ran out of his Alprazolam 08/12/12  and is requesting refill.  Triage per Anxiety Protocol. No emergent symptoms identified. Care avice given per guidelines related to positive Triage assessment for New or increasing symptoms and not taking medication. Patient advised on relaxation techniques. Call back parameters reviewed. RN reviewed in Epic, Electronic Health record that patient was last seen in office, per Dr. Drue Novel, on 07/19/12. Patient has follow up appointment scheduled for 08/23/12. Patient last had prescription for Alprazolam 0.5mg   filled on 07/19/12, 60 tablets dispensed; with instructions ofr 1/2 to one tablet 3 times a day as needed and one tablet at bedtime as needed for sleep.  PATIENT REQUESTING REFILL FOR ALPRAZOLAM 0.5MG  AS ABOVE. PATIENT USES [facility] ON [address] AT [phone]. PATIENT CAN BE REACHED AT [phone]. PLEASE RETURN CALL TO PATIENT TO INFORM HIM IF PRESCRIPTION IS BEING CALLED IN TO PHARMACY OR IF APPOINTMENT IS RECOMMEDED.

## 2012-08-14 NOTE — Telephone Encounter (Signed)
Pt requesting medication for anxiety/sedative. He is having to admit his brother into a hospice facility and is not handling it well. He uses Therapist, occupational on [address].

## 2012-08-14 NOTE — Telephone Encounter (Signed)
Ok to refill 

## 2012-08-14 NOTE — Telephone Encounter (Signed)
Ok 60, 1 RF 

## 2012-08-14 NOTE — Telephone Encounter (Signed)
Refill done.  

## 2012-08-16 ENCOUNTER — Encounter: Payer: Medicare Other | Admitting: Physical Therapy

## 2012-08-21 ENCOUNTER — Encounter: Payer: Medicare Other | Admitting: Physical Therapy

## 2012-08-23 ENCOUNTER — Encounter (HOSPITAL_COMMUNITY): Payer: Self-pay | Admitting: Emergency Medicine

## 2012-08-23 ENCOUNTER — Emergency Department (HOSPITAL_COMMUNITY): Payer: Medicare Other

## 2012-08-23 ENCOUNTER — Encounter: Payer: Medicare Other | Admitting: Physical Therapy

## 2012-08-23 ENCOUNTER — Ambulatory Visit: Payer: Medicare Other | Admitting: Internal Medicine

## 2012-08-23 ENCOUNTER — Observation Stay (HOSPITAL_COMMUNITY)
Admission: EM | Admit: 2012-08-23 | Discharge: 2012-08-24 | Disposition: A | Discharge status: Home / Self Care | Payer: Medicare Other | Attending: Family Medicine | Admitting: Family Medicine

## 2012-08-23 DIAGNOSIS — G8929 Other chronic pain: Secondary | ICD-10-CM | POA: Insufficient documentation

## 2012-08-23 DIAGNOSIS — F418 Other specified anxiety disorders: Secondary | ICD-10-CM | POA: Diagnosis present

## 2012-08-23 DIAGNOSIS — K279 Peptic ulcer, site unspecified, unspecified as acute or chronic, without hemorrhage or perforation: Secondary | ICD-10-CM

## 2012-08-23 DIAGNOSIS — J449 Chronic obstructive pulmonary disease, unspecified: Secondary | ICD-10-CM | POA: Diagnosis present

## 2012-08-23 DIAGNOSIS — R945 Abnormal results of liver function studies: Secondary | ICD-10-CM

## 2012-08-23 DIAGNOSIS — K219 Gastro-esophageal reflux disease without esophagitis: Secondary | ICD-10-CM

## 2012-08-23 DIAGNOSIS — Z860100 Personal history of colon polyps, unspecified: Secondary | ICD-10-CM

## 2012-08-23 DIAGNOSIS — R079 Chest pain, unspecified: Secondary | ICD-10-CM

## 2012-08-23 DIAGNOSIS — R5381 Other malaise: Secondary | ICD-10-CM | POA: Insufficient documentation

## 2012-08-23 DIAGNOSIS — G459 Transient cerebral ischemic attack, unspecified: Principal | ICD-10-CM

## 2012-08-23 DIAGNOSIS — M353 Polymyalgia rheumatica: Secondary | ICD-10-CM

## 2012-08-23 DIAGNOSIS — M129 Arthropathy, unspecified: Secondary | ICD-10-CM | POA: Insufficient documentation

## 2012-08-23 DIAGNOSIS — D509 Iron deficiency anemia, unspecified: Secondary | ICD-10-CM

## 2012-08-23 DIAGNOSIS — R0602 Shortness of breath: Secondary | ICD-10-CM

## 2012-08-23 DIAGNOSIS — R5383 Other fatigue: Secondary | ICD-10-CM | POA: Insufficient documentation

## 2012-08-23 DIAGNOSIS — M899 Disorder of bone, unspecified: Secondary | ICD-10-CM

## 2012-08-23 DIAGNOSIS — M1990 Unspecified osteoarthritis, unspecified site: Secondary | ICD-10-CM

## 2012-08-23 DIAGNOSIS — R002 Palpitations: Secondary | ICD-10-CM

## 2012-08-23 DIAGNOSIS — M549 Dorsalgia, unspecified: Secondary | ICD-10-CM

## 2012-08-23 DIAGNOSIS — M542 Cervicalgia: Secondary | ICD-10-CM

## 2012-08-23 DIAGNOSIS — E039 Hypothyroidism, unspecified: Secondary | ICD-10-CM

## 2012-08-23 DIAGNOSIS — K5730 Diverticulosis of large intestine without perforation or abscess without bleeding: Secondary | ICD-10-CM

## 2012-08-23 DIAGNOSIS — R634 Abnormal weight loss: Secondary | ICD-10-CM

## 2012-08-23 DIAGNOSIS — N402 Nodular prostate without lower urinary tract symptoms: Secondary | ICD-10-CM

## 2012-08-23 DIAGNOSIS — S81809A Unspecified open wound, unspecified lower leg, initial encounter: Secondary | ICD-10-CM

## 2012-08-23 DIAGNOSIS — R61 Generalized hyperhidrosis: Secondary | ICD-10-CM | POA: Insufficient documentation

## 2012-08-23 DIAGNOSIS — H269 Unspecified cataract: Secondary | ICD-10-CM

## 2012-08-23 DIAGNOSIS — J4489 Other specified chronic obstructive pulmonary disease: Secondary | ICD-10-CM

## 2012-08-23 DIAGNOSIS — I951 Orthostatic hypotension: Secondary | ICD-10-CM

## 2012-08-23 DIAGNOSIS — Z85828 Personal history of other malignant neoplasm of skin: Secondary | ICD-10-CM

## 2012-08-23 DIAGNOSIS — N400 Enlarged prostate without lower urinary tract symptoms: Secondary | ICD-10-CM

## 2012-08-23 DIAGNOSIS — R799 Abnormal finding of blood chemistry, unspecified: Secondary | ICD-10-CM

## 2012-08-23 DIAGNOSIS — I4949 Other premature depolarization: Secondary | ICD-10-CM

## 2012-08-23 DIAGNOSIS — I2510 Atherosclerotic heart disease of native coronary artery without angina pectoris: Secondary | ICD-10-CM

## 2012-08-23 DIAGNOSIS — R4182 Altered mental status, unspecified: Secondary | ICD-10-CM

## 2012-08-23 DIAGNOSIS — F329 Major depressive disorder, single episode, unspecified: Secondary | ICD-10-CM | POA: Insufficient documentation

## 2012-08-23 DIAGNOSIS — Z862 Personal history of diseases of the blood and blood-forming organs and certain disorders involving the immune mechanism: Secondary | ICD-10-CM

## 2012-08-23 DIAGNOSIS — F3289 Other specified depressive episodes: Secondary | ICD-10-CM

## 2012-08-23 DIAGNOSIS — E785 Hyperlipidemia, unspecified: Secondary | ICD-10-CM

## 2012-08-23 DIAGNOSIS — R296 Repeated falls: Secondary | ICD-10-CM | POA: Insufficient documentation

## 2012-08-23 DIAGNOSIS — E291 Testicular hypofunction: Secondary | ICD-10-CM

## 2012-08-23 DIAGNOSIS — I720 Aneurysm of carotid artery: Secondary | ICD-10-CM

## 2012-08-23 DIAGNOSIS — Z79899 Other long term (current) drug therapy: Secondary | ICD-10-CM | POA: Insufficient documentation

## 2012-08-23 LAB — URINALYSIS, MICROSCOPIC ONLY
Bilirubin Urine: NEGATIVE
Hgb urine dipstick: NEGATIVE
Ketones, ur: NEGATIVE mg/dL
Nitrite: NEGATIVE
Specific Gravity, Urine: 1.024 (ref 1.005–1.030)
Urobilinogen, UA: 0.2 mg/dL (ref 0.0–1.0)
pH: 5.5 (ref 5.0–8.0)

## 2012-08-23 LAB — CBC WITH DIFFERENTIAL/PLATELET
Hemoglobin: 13.3 g/dL (ref 13.0–17.0)
Lymphocytes Relative: 7 % — ABNORMAL LOW (ref 12–46)
Lymphs Abs: 1 10*3/uL (ref 0.7–4.0)
MCH: 32.4 pg (ref 26.0–34.0)
Monocytes Relative: 7 % (ref 3–12)
Neutro Abs: 11.8 10*3/uL — ABNORMAL HIGH (ref 1.7–7.7)
Neutrophils Relative %: 85 % — ABNORMAL HIGH (ref 43–77)
Platelets: 219 10*3/uL (ref 150–400)
RBC: 4.11 MIL/uL — ABNORMAL LOW (ref 4.22–5.81)
WBC: 13.9 10*3/uL — ABNORMAL HIGH (ref 4.0–10.5)

## 2012-08-23 LAB — COMPREHENSIVE METABOLIC PANEL
ALT: 33 U/L (ref 0–53)
Alkaline Phosphatase: 72 U/L (ref 39–117)
BUN: 19 mg/dL (ref 6–23)
CO2: 22 mEq/L (ref 19–32)
Chloride: 105 mEq/L (ref 96–112)
GFR calc Af Amer: 78 mL/min — ABNORMAL LOW (ref >90)
Glucose, Bld: 112 mg/dL — ABNORMAL HIGH (ref 70–99)
Potassium: 4.4 mEq/L (ref 3.5–5.1)
Sodium: 138 mEq/L (ref 135–145)
Total Bilirubin: 0.4 mg/dL (ref 0.3–1.2)

## 2012-08-23 LAB — RAPID URINE DRUG SCREEN, HOSP PERFORMED
Amphetamines: NOT DETECTED
Barbiturates: NOT DETECTED
Benzodiazepines: POSITIVE — AB

## 2012-08-23 LAB — PROTIME-INR
INR: 1.01 (ref 0.00–1.49)
Prothrombin Time: 13.5 seconds (ref 11.6–15.2)

## 2012-08-23 LAB — GLUCOSE, CAPILLARY
Glucose-Capillary: 88 mg/dL (ref 70–99)
Glucose-Capillary: 96 mg/dL (ref 70–99)

## 2012-08-23 LAB — ETHANOL: Alcohol, Ethyl (B): <11 mg/dL (ref 0–11)

## 2012-08-23 MED ORDER — ASPIRIN 325 MG PO TABS
325.0000 mg | ORAL_TABLET | Freq: Every day | ORAL | Status: DC
  Administered 2012-08-23 – 2012-08-24 (×2): 325 mg via ORAL
  Filled 2012-08-23 (×2): qty 1

## 2012-08-23 MED ORDER — HYDROMORPHONE HCL 4 MG PO TABS
6.0000 mg | ORAL_TABLET | Freq: Four times a day (QID) | ORAL | Status: DC | PRN
  Administered 2012-08-23 – 2012-08-24 (×3): 6 mg via ORAL
  Filled 2012-08-23 (×3): qty 2

## 2012-08-23 MED ORDER — ENSURE COMPLETE PO LIQD
237.0000 mL | Freq: Two times a day (BID) | ORAL | Status: DC
  Administered 2012-08-24: 237 mL via ORAL

## 2012-08-23 MED ORDER — CYCLOBENZAPRINE HCL 10 MG PO TABS
10.0000 mg | ORAL_TABLET | Freq: Three times a day (TID) | ORAL | Status: DC | PRN
  Filled 2012-08-23: qty 1

## 2012-08-23 MED ORDER — ALPRAZOLAM 0.25 MG PO TABS: 0.2500 mg | ORAL_TABLET | Freq: Every evening | ORAL | Status: DC | PRN

## 2012-08-23 MED ORDER — SODIUM CHLORIDE 0.9 % IV SOLN
INTRAVENOUS | Status: DC
  Administered 2012-08-23: 75 mL/h via INTRAVENOUS

## 2012-08-23 MED ORDER — TIOTROPIUM BROMIDE MONOHYDRATE 18 MCG IN CAPS
18.0000 ug | ORAL_CAPSULE | Freq: Every day | RESPIRATORY_TRACT | Status: DC
  Administered 2012-08-24: 18 ug via RESPIRATORY_TRACT
  Filled 2012-08-23: qty 5

## 2012-08-23 MED ORDER — VENLAFAXINE HCL ER 75 MG PO CP24
75.0000 mg | ORAL_CAPSULE | Freq: Every day | ORAL | Status: DC
Start: 2012-08-24 — End: 2012-08-24
  Administered 2012-08-24: 75 mg via ORAL
  Filled 2012-08-23: qty 1

## 2012-08-23 MED ORDER — FLUTICASONE PROPIONATE HFA 44 MCG/ACT IN AERO
1.0000 | INHALATION_SPRAY | Freq: Two times a day (BID) | RESPIRATORY_TRACT | Status: DC
  Administered 2012-08-23 – 2012-08-24 (×2): 1 via RESPIRATORY_TRACT
  Filled 2012-08-23: qty 10.6

## 2012-08-23 MED ORDER — HYDROMORPHONE HCL 4 MG PO TABS: 8.0000 mg | ORAL_TABLET | Freq: Four times a day (QID) | ORAL | Status: DC | PRN

## 2012-08-23 MED ORDER — PANTOPRAZOLE SODIUM 40 MG PO TBEC
40.0000 mg | DELAYED_RELEASE_TABLET | Freq: Every day | ORAL | Status: DC
  Filled 2012-08-23: qty 1

## 2012-08-23 MED ORDER — ALPRAZOLAM 0.5 MG PO TABS: 0.5000 mg | ORAL_TABLET | Freq: Every evening | ORAL | Status: DC | PRN

## 2012-08-23 MED ORDER — OMEPRAZOLE MAGNESIUM 20 MG PO TBEC: 20.0000 mg | DELAYED_RELEASE_TABLET | Freq: Every day | ORAL | Status: DC

## 2012-08-23 MED ORDER — SIMVASTATIN 20 MG PO TABS
20.0000 mg | ORAL_TABLET | Freq: Every day | ORAL | Status: DC
  Administered 2012-08-23: 20 mg via ORAL
  Filled 2012-08-23 (×2): qty 1

## 2012-08-23 MED ORDER — BROMOCRIPTINE MESYLATE 2.5 MG PO TABS
2.5000 mg | ORAL_TABLET | Freq: Every day | ORAL | Status: DC
  Administered 2012-08-23: 2.5 mg via ORAL
  Filled 2012-08-23 (×2): qty 1

## 2012-08-23 MED ORDER — ALBUTEROL SULFATE HFA 108 (90 BASE) MCG/ACT IN AERS: 2.0000 | INHALATION_SPRAY | Freq: Four times a day (QID) | RESPIRATORY_TRACT | Status: DC | PRN

## 2012-08-23 MED ORDER — LEVOTHYROXINE SODIUM 25 MCG PO TABS
25.0000 ug | ORAL_TABLET | Freq: Every day | ORAL | Status: DC
  Administered 2012-08-23: 25 ug via ORAL
  Filled 2012-08-23 (×2): qty 1

## 2012-08-23 MED ORDER — PREDNISONE 10 MG PO TABS
10.0000 mg | ORAL_TABLET | Freq: Every day | ORAL | Status: DC
  Administered 2012-08-24: 10 mg via ORAL
  Filled 2012-08-23 (×2): qty 1

## 2012-08-23 NOTE — ED Notes (Addendum)
Pt. Was put into gown,vitals sins were taken,EKG was done

## 2012-08-23 NOTE — ED Notes (Signed)
EMS reports patient felt better upon arrival.

## 2012-08-23 NOTE — Progress Notes (Signed)
INITIAL ADULT NUTRITION ASSESSMENT Date: 08/23/2012   Time: 4:56 PM Reason for Assessment: Nutrition risk   INTERVENTION: Ensure Complete BID. Pt denied wanting chaplain consult to help deal with recent loss of brother. Encouraged gradual increased meal intake. RD to monitor intake.   Pt meets criteria for severe PCM of social/environmental circumstances AEB <75% estimated energy intake with 7.5% weight loss in the past month per pt report.    ASSESSMENT: Male 78 y.o.  Dx: TIA (transient ischemic attack)  Food/Nutrition Related Hx: Pt reports poor appetite in the past month with 12 pound unintended weight loss. Pt reports his brother passed away in this hospital recently within the past 2 weeks so he has been under a great deal of stress per wife's statement. Wife reports pt's appetite improved in the last week. Pt states he drinks between 1-2 Ensure/day at home. Pt admitted with near syncope and some concern for TIA.   Hx:  Past Medical History  Diagnosis Date  . Chronic neck pain     and HA, dx w/ cervical stenosis  . GERD (gastroesophageal reflux disease)   . COPD (chronic obstructive pulmonary disease)   . Hyperlipidemia   . Peptic ulcer disease   . BPH (benign prostatic hypertrophy)   . Depression   . CAD (coronary artery disease)     per cath: mild/non obstructive 03/2006  . Osteopenia   . 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 Pulmonary nodule     s/p Ct x 2 last 05/2008: scarring  . Gallbladder & bile duct stone     per [place] 2007   Related Meds:  Scheduled Meds:  Continuous Infusions:   . sodium chloride     Ht: 5\' 3"  (160 cm)  Wt: 148 lb 13 oz (67.5 kg)  Ideal Wt: 115 lb % Ideal Wt: 128  Usual Wt: 160 lb per pt report % Usual Wt: 92  Body mass index is 26.36 kg/(m^2).   Labs:  CMP     Component Value Date/Time   NA 138 08/23/2012 0953   K 4.4 08/23/2012 0953   CL 105 08/23/2012 0953   CO2 22 08/23/2012 0953   GLUCOSE 112* 08/23/2012 0953   BUN 19 08/23/2012 0953   CREATININE 1.04 08/23/2012 0953   CALCIUM 9.0 08/23/2012 0953   PROT 6.3 08/23/2012 0953   ALBUMIN 3.7 08/23/2012 0953   AST 24 08/23/2012 0953   ALT 33 08/23/2012 0953   ALKPHOS 72 08/23/2012 0953   BILITOT 0.4 08/23/2012 0953   GFRNONAA 67* 08/23/2012 0953   GFRAA 78* 08/23/2012 0953    Intake/Output Summary (Last 24 hours) at 08/23/12 1659 Last data filed at 08/23/12 1205  Gross per 24 hour  Intake      0 ml  Output    300 ml  Net   -300 ml   Last BM - 9/6  Diet Order: General   IVF:    sodium chloride    Estimated Nutritional Needs:   Kcal:1675-1900 Protein:70-80g Fluid:1.6-1.9L  NUTRITION DIAGNOSIS: -Inadequate oral intake (NI-2.1).  Status: Ongoing  RELATED TO: poor appetite, stress  AS EVIDENCE BY: pt statement, reported weight loss PTA  MONITORING/EVALUATION(Goals): Pt to consume >75% of meals/supplements.   EDUCATION NEEDS: -No education needs identified at this time   Dietitian #: [phone]  DOCUMENTATION CODES Per approved criteria  -Severe  malnutrition in the context of social or environmental circumstances    Marshall Cork 08/23/2012, 4:56  PM

## 2012-08-23 NOTE — ED Notes (Signed)
Patient states he was just started on the Dilaudid PO yesterday

## 2012-08-23 NOTE — H&P (Signed)
[facility] History and Physical  Dakota [name] YNW:[id number] DOB: March 02, 1933 DOA: 08/23/2012  Referring physician: Dr. Lynelle Doctor PCP: [name], MD   Chief Complaint: Weakness, fall  HPI: Dakota [name] is a 78 y.o. male  Patient is a 78 y/o CM that presents to the hospital with complaint of weakness and fall.  He mentions that he felt so weak he just collapsed today.  He denies any blurred vision or fainting prior to falling.  Mentions that he took his dilaudid this morning (which he takes for arthritis).  He mentions that he drove to his work was standing felt flushed and fell.  Was taken subsequently to the hospital for further evaluation.  The problem happened insidiously and has resolved.  He has had this problem before and has had work up as outpatient but has not been told why he is experiencing this new problem. Nothing makes it worse that he is aware of.  He denies any palpitations prior to fall.  Also denies any seizure like activity or loss of consciousness.  Mentions that initially they noticed that he was having difficulty with his speech but has resolved since the problem occurred.    Review of Systems: The patient denies anorexia, fever, weight loss,, vision loss, decreased hearing, hoarseness, chest pain, (fall but denies any syncope), dyspnea on exertion, peripheral edema, balance deficits, hemoptysis, abdominal pain, melena, hematochezia, severe indigestion/heartburn, hematuria, incontinence, genital sores, muscle weakness, suspicious skin lesions, transient blindness, difficulty walking, depression, unusual weight change, abnormal bleeding, enlarged lymph nodes, angioedema, and breast masses.    Past Medical History  Diagnosis Date  . Chronic neck pain     and HA, dx w/ cervical stenosis  . GERD (gastroesophageal reflux disease)   . COPD (chronic obstructive pulmonary disease)   . Hyperlipidemia   . Peptic ulcer disease   . BPH (benign prostatic hypertrophy)   . Depression    . CAD (coronary artery disease)     per cath: mild/non obstructive 03/2006  . Osteopenia   . 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 Pulmonary nodule     s/p Ct x 2 last 05/2008: scarring  . Gallbladder & bile duct stone     per [place] 2007   Past Surgical History  Procedure Date  . Arthoscopic rotaor cuff repair     L-2003, R-2004 Dr Ranell Patrick  . Inguinal hernia repair     r  . Ulcer surgery 1978  . Spinal cord stimulator insertion 5/11    lower back   Social History:  reports that he has quit smoking. He has never used smokeless tobacco. He reports that he drinks alcohol. His drug history not on file. Patient lives at home with wife He participates in his ADL's reportedly  Allergies  Allergen Reactions  . Sulfonamide Derivatives     REACTION: unspecified    Family History  Problem Relation Age of Onset  . Heart attack Neg Hx   . Diabetes Neg Hx   . Colon cancer Neg Hx   . Prostate cancer Father   . Prostate cancer Brother   Other than the things mentioned above he did not report any new diseases that run in his family  Prior to Admission medications   Medication Sig Start Date End Date Taking? Authorizing Provider  albuterol (PROVENTIL HFA;VENTOLIN HFA) 108 (90 BASE) MCG/ACT inhaler Inhale 2 puffs into the lungs every 6 (six) hours as needed. Shortness of breath  09/08/11  Yes Wanda Plump, MD  ALPRAZolam Prudy Feeler) 0.5 MG tablet Take 0.5 mg by mouth at bedtime as needed. 1/2-1 by mouth three times a day as needed. 1 tab at bedtime as needed for insomnia. 08/14/12  Yes Wanda Plump, MD  aspirin 81 MG tablet Take 81 mg by mouth daily.     Yes Historical Provider, MD  beclomethasone (QVAR) 80 MCG/ACT inhaler Inhale 2 puffs into the lungs daily. 09/12/11 09/11/12 Yes Wanda Plump, MD  bromocriptine (PARLODEL) 2.5 MG tablet Take 1 tablet (2.5 mg total) by mouth at bedtime. 07/24/12 07/24/13 Yes Romero Belling, MD  cyclobenzaprine (FLEXERIL) 10 MG tablet Take 10 mg by mouth 3  (three) times daily as needed. For muscle spasms   Yes Historical Provider, MD  HYDROmorphone (DILAUDID) 8 MG tablet 8 mg every 6 (six) hours as needed. For pain   Yes Historical Provider, MD  levothyroxine (SYNTHROID, LEVOTHROID) 25 MCG tablet Take 25 mcg by mouth at bedtime. 06/18/12 06/18/13 Yes Wanda Plump, MD  omeprazole (PRILOSEC OTC) 20 MG tablet Take 20 mg by mouth daily.     Yes Historical Provider, MD  predniSONE (DELTASONE) 10 MG tablet Take 1 tablet (10 mg total) by mouth daily. 02/22/12  Yes Wanda Plump, MD  sertraline (ZOLOFT) 100 MG tablet Take 1 tablet (100 mg total) by mouth daily. 02/20/12 02/19/13 Yes Wanda Plump, MD  simvastatin (ZOCOR) 20 MG tablet Take 1 tablet (20 mg total) by mouth at bedtime. 02/20/12  Yes Wanda Plump, MD  tiotropium (SPIRIVA) 18 MCG inhalation capsule Place 18 mcg into inhaler and inhale daily.   Yes Historical Provider, MD  venlafaxine XR (EFFEXOR-XR) 75 MG 24 hr capsule Take 75 mg by mouth daily.   Yes Historical Provider, MD  zolpidem (AMBIEN) 10 MG tablet Take 10 mg by mouth at bedtime as needed. For sleep 07/19/12  Yes Wanda Plump, MD   Physical Exam: Filed Vitals:   08/23/12 1055 08/23/12 1057 08/23/12 1148 08/23/12 1458  BP: 117/57 94/49 131/60 144/75  Pulse: 87 88 76 68  Temp:   98.4 F (36.9 C) 97.5 F (36.4 C)  TempSrc:   Oral Oral  Resp:   18 18  Height:    5\' 3"  (1.6 m)  Weight:    67.5 kg (148 lb 13 oz)  SpO2:   99% 91%     General:  Pt in NAD, A and O x 3  Eyes: EOMI, PERRLA  ENT: No masses on visual inspection, normal exterior appearance  Neck: No masses on visual inspection, No goiter  Cardiovascular: RRR, No MRG  Respiratory: Clear to Auscultation, no wheezes  Abdomen: Soft, NT, ND  Skin: no diaphoresis or new rashes. Did have abbrasion bruising over right knee  Musculoskeletal: No cyanosis or clubbing   Psychiatric: Mood and affect appropriate  Neurologic: patient answers questions appropriately and moves all extremities  equally  Labs on Admission:  Basic Metabolic Panel:  Lab 08/23/12 3086  NA 138  K 4.4  CL 105  CO2 22  GLUCOSE 112*  BUN 19  CREATININE 1.04  CALCIUM 9.0  MG --  PHOS --   Liver Function Tests:  Lab 08/23/12 0953  AST 24  ALT 33  ALKPHOS 72  BILITOT 0.4  PROT 6.3  ALBUMIN 3.7   No results found for this basename: LIPASE:5,AMYLASE:5 in the last 168 hours No results found for this basename: AMMONIA:5 in the last 168 hours CBC:  Lab  08/23/12 0953  WBC 13.9*  NEUTROABS 11.8*  HGB 13.3  HCT 39.8  MCV 96.8  PLT 219   Cardiac Enzymes: No results found for this basename: CKTOTAL:5,CKMB:5,CKMBINDEX:5,TROPONINI:5 in the last 168 hours  BNP (last 3 results) No results found for this basename: PROBNP:3 in the last 8760 hours CBG: No results found for this basename: GLUCAP:5 in the last 168 hours  Radiological Exams on Admission: Ct Head Wo Contrast  08/23/2012  *RADIOLOGY REPORT*  Clinical Data: Near-syncope.  Dizziness.  Weakness.  CT HEAD WITHOUT CONTRAST  Technique:  Contiguous axial images were obtained from the base of the skull through the vertex without contrast.  Comparison: 05/07/2012  Findings: There is no evidence for acute hemorrhage, hydrocephalus, mass lesion, or abnormal extra-axial fluid collection.  No definite CT evidence for acute infarction.  Diffuse loss of parenchymal volume is consistent with atrophy. Patchy low attenuation in the deep hemispheric and periventricular white matter is nonspecific, but likely reflects chronic microvascular ischemic demyelination.  The visualized paranasal sinuses and mastoid air cells are clear.  IMPRESSION: Stable.  No acute intracranial abnormality.  Atrophy with chronic small vessel white matter ischemic demyelination.   Original Report Authenticated By: ERIC A. MANSELL, M.D.    Dg Chest Portable 1 View  08/23/2012  *RADIOLOGY REPORT*  Clinical Data: Cough, congestion, shortness of breath  PORTABLE CHEST - 1 VIEW   Comparison: 07/09/2012  Findings: Cardiomegaly with mild interstitial edema. Bibasilar opacities, likely atelectasis. No pleural effusion or pneumothorax.  Thoracic spine stimulator.  Surgical clips overlying the GE junction / upper abdomen.  IMPRESSION: Cardiomegaly with mild interstitial edema.  Bibasilar opacities, likely atelectasis.   Original Report Authenticated By: Charline Bills, M.D.     EKG: Independently reviewed. Sinus Rhythm  Assessment/Plan Principal Problem:  *TIA (transient ischemic attack) Active Problems:  HYPERLIPIDEMIA  DEPRESSION  CORONARY ARTERY DISEASE  COPD  GERD  DIVERTICULOSIS, COLON  NECK PAIN, CHRONIC  LIVER FUNCTION TESTS, ABNORMAL  Weight loss and night sweats  Hypothyroidism   1. TIA - Concern is for TIA given that patient had difficulty speaking after incident.  I am suspecting that patient may have had orthostatic hypotension made worse by his opiod regimen and polypharmacy.  I will plan on decreasing his oral dilaudid. - monitor on telemetry - due to metallic implants patient will not be able to obtain mri for further imaging - carotid doppler - Echocardiogram - TSH if not already ordered - Aspirin - Physical Therapy to evaluate - Orthostatic blood pressure checks - Patient mentions that he takes xanax daily. I think this may be contributing to his weakness and as a result will plan on cutting the dose.  2. COPD - Stable will continue home regimen  3. Hypothyroidism - check tsh level - continue home regimen  4. Arthritis - Continue dilaudid at lower dose -- stable currently.  5. Depression - Continue home regimen - Stable.  Reportedly he does not take zoloft everyday  6. GERD - continue home regimen - stable currently   Code Status: DNR Family Communication: Spoke with patient and wife at bedside Disposition Plan: Pending clinical improvement in condition.  Time spent: > 65 minutes  Penny Pia [facility] Pager [phone]  If 7PM-7AM, please contact night-coverage [email or web address] Password [facility] 08/23/2012, 4:39 PM
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
## 2012-08-23 NOTE — ED Notes (Signed)
[id number]<AV> Expected date:<BR> Expected time:<BR> Means of arrival:<BR> Comments:<BR> ems-near syncope

## 2012-08-23 NOTE — ED Notes (Signed)
EMS reports patient reported he got dizzy and weak and felt like he was going to pass out.  Patient states he's been feeling weak for about a month.  Negative LOC.

## 2012-08-23 NOTE — ED Notes (Signed)
PT. Tried to urinate unsuccessful x2

## 2012-08-23 NOTE — ED Notes (Signed)
Pt. Walk to bathroom to urinate unsuccessful

## 2012-08-23 NOTE — Progress Notes (Signed)
Dakota [name], is a 78 y.o. male,   MRN: [id number]  -  DOB - Mar 14, 1933  Outpatient Primary MD for the patient is [name], MD  in for    Chief Complaint  Patient presents with  . Near Syncope     Blood pressure 131/60, pulse 76, temperature 98.4 F (36.9 C), temperature source Oral, resp. rate 18, SpO2 99.00%.  Principal Problem:  *TIA (transient ischemic attack) Active Problems:  HYPERLIPIDEMIA  DEPRESSION  CORONARY ARTERY DISEASE  COPD  GERD  DIVERTICULOSIS, COLON  NECK PAIN, CHRONIC  LIVER FUNCTION TESTS, ABNORMAL  Weight loss and night sweats  Hypothyroidism   78 yo with multiple medical problems including the above presents to Ed this am near syncope. Reportedly pts friend noticed pt having difficulty getting out of car this am, walking slowly and having "thick speech". Pt commented that he did not feel well and sat down on ground. No report of  the patient being confused but stated it just seemed the patient was having trouble making his body move. Patient reported the only new medication he is taking is a thyroid supplement that he started yesterday.  He denies headache, chest pain, abdominal pain, numbness in his arms or legs. He does state he fell several times in August and did hit his head and indicates the frontal portion of his head. Wife indicates pt complained of dizziness this am.   In Ed head CT yields  No acute intracranial abnormality.  Atrophy with chronic small vessel white matter ischemic  Demyelination. Pt cannot have MRI due to spinal cord stimulator. Afebrile, SBP range 94-137 HR 88 sats 99 on 2L.   On exam speech slow but clear, seems to be speaking very deliberately. Oriented x3 slightly lethargic. MOE. No focal motor weakness.   Some question around meds recently being changed. Denies any change in pain meds. Home meds include dilaudid, xanax, flexeril, zoloft, ambien.  Some concern for TIA, will admit to tele for obs.

## 2012-08-24 DIAGNOSIS — I951 Orthostatic hypotension: Secondary | ICD-10-CM

## 2012-08-24 DIAGNOSIS — G459 Transient cerebral ischemic attack, unspecified: Secondary | ICD-10-CM

## 2012-08-24 DIAGNOSIS — M1990 Unspecified osteoarthritis, unspecified site: Secondary | ICD-10-CM

## 2012-08-24 LAB — GLUCOSE, CAPILLARY
Glucose-Capillary: 107 mg/dL — ABNORMAL HIGH (ref 70–99)
Glucose-Capillary: 126 mg/dL — ABNORMAL HIGH (ref 70–99)

## 2012-08-24 LAB — LIPID PANEL
Cholesterol: 157 mg/dL (ref 0–200)
HDL: 35 mg/dL — ABNORMAL LOW (ref >39)
LDL Cholesterol: 79 mg/dL (ref 0–99)
Triglycerides: 216 mg/dL — ABNORMAL HIGH (ref <150)

## 2012-08-24 LAB — HEMOGLOBIN A1C: Hgb A1c MFr Bld: 5.8 % — ABNORMAL HIGH (ref <5.7)

## 2012-08-24 LAB — TSH: TSH: 5.995 u[IU]/mL — ABNORMAL HIGH (ref 0.350–4.500)

## 2012-08-24 MED ORDER — HYDROMORPHONE HCL 2 MG PO TABS: 6.0000 mg | ORAL_TABLET | Freq: Four times a day (QID) | ORAL | Status: AC | PRN

## 2012-08-24 MED ORDER — ZOLPIDEM TARTRATE 5 MG PO TABS: 5.0000 mg | ORAL_TABLET | Freq: Every evening | ORAL | Status: DC | PRN

## 2012-08-24 NOTE — Evaluation (Signed)
Physical Therapy Evaluation Patient Details Name: Dakota [name] MRN: [id number] DOB: 02/09/1933 Today's Date: 08/24/2012 Time: 7829-5621 PT Time Calculation (min): 22 min  PT Assessment / Plan / Recommendation Clinical Impression  78 y.o. male admitted with weakness and fall, Dx: TIA.  NOted pt has orthostatic hypotension documented in doc flowsheets.  Per chart this may be due to medication. Pt would benefit from OUTPATIENT PT to address balance as he is unsteady when initially standing. He ambulated 200' without LOB.     PT Assessment  All further PT needs can be met in the next venue of care    Follow Up Recommendations  Outpatient PT    Barriers to Discharge        Equipment Recommendations  None recommended by PT    Recommendations for Other Services     Frequency      Precautions / Restrictions Precautions Precautions: Fall Precaution Comments: 6 falls in August Restrictions Weight Bearing Restrictions: No   Pertinent Vitals/Pain *0/10 pain**      Mobility  Bed Mobility Bed Mobility: Supine to Sit Supine to Sit: Not tested (comment) Transfers Transfers: Stand to Sit;Sit to Stand Sit to Stand: 4: Min guard Stand to Sit: 5: Supervision;To chair/3-in-1 Details for Transfer Assistance: upon standing pt was unsteady with posterior trunk sway for approx. 1 minute, able to self correct. Encouraged pt to wait for 1-2 minutes prior to walking when first standing.  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 Communication Communication: Expressive difficulties (very mild delay with word finding occaissionally)    Cognition  Overall Cognitive Status: Appears within functional limits for tasks assessed/performed Arousal/Alertness: Awake/alert Orientation Level: Appears intact for tasks assessed Behavior During Session: [facility] for tasks performed    Extremity/Trunk Assessment Right Upper Extremity Assessment RUE ROM/Strength/Tone: [facility] for tasks assessed Left Upper Extremity Assessment LUE ROM/Strength/Tone: WFL for tasks assessed Right Lower Extremity Assessment RLE ROM/Strength/Tone: Within functional levels RLE Sensation: WFL - Light Touch RLE Coordination: WFL - gross/fine motor Left Lower Extremity Assessment LLE ROM/Strength/Tone: Within functional levels LLE Sensation: WFL - Light Touch LLE Coordination: WFL - gross/fine motor Trunk Assessment Trunk Assessment: Normal   Balance Balance Balance Assessed: Yes Static Standing Balance Static Standing - Balance Support: No upper extremity supported Static Standing - Comment/# of Minutes: 2 minutes.  Pt unable to stand with feet together, has LOB.  Can walk and turn head side to side without LOB.   End of Session PT - End of Session Equipment Utilized During Treatment: Gait belt Patient left: in chair;with call bell/phone within reach;with family/visitor present Nurse Communication: Mobility status  GP Functional  Assessment Tool Used: clinical  judgement Functional Limitation: Mobility: Walking and moving around Mobility: Walking and Moving Around Current Status [phone]): At least 1 percent but less than 20 percent impaired, limited or restricted Mobility: Walking and Moving Around Goal Status [phone]): At least 1 percent but less than 20 percent impaired, limited or restricted Mobility: Walking and Moving Around Discharge Status [phone]): At least 1 percent but less than 20 percent impaired, limited or restricted   Tamala Ser 08/24/2012, 2:47 PM  [phone]

## 2012-08-24 NOTE — Discharge Summary (Addendum)
Physician Discharge Summary  Dakota [name] ZOX:[id number] DOB: [birth year] DOA: 08/23/2012  PCP: [name], MD  Admit date: 08/23/2012 Discharge date: 08/24/2012  Recommendations for Outpatient Follow-up:  1. Please follow up on patient's medications as he is on dilaudid, xanax, and was on high dose ambien.  I have decreased these medications and have recommended that patient take more po fluid intake.   2. Please consider further decreasing these medications should patient continue to have problems with his balance. 3. Also please repeat TSH level as TSH was elevated while in house.  Discharge Diagnoses:  Principal Problem:  *TIA (transient ischemic attack) Active Problems:  HYPERLIPIDEMIA  DEPRESSION  CORONARY ARTERY DISEASE  COPD  GERD  DIVERTICULOSIS, COLON  NECK PAIN, CHRONIC  LIVER FUNCTION TESTS, ABNORMAL  Weight loss and night sweats  Hypothyroidism   Discharge Condition: Stable  Diet recommendation: cardiac diet  Filed Weights   08/23/12 1458  Weight: 67.5 kg (148 lb 13 oz)    History of present illness:  From original HPI: Dakota [name] is a 78 y.o. male  Patient is a 78 y/o CM that presents to the hospital with complaint of weakness and fall. He mentions that he felt so weak he just collapsed today. He denies any blurred vision or fainting prior to falling. Mentions that he took his dilaudid this morning (which he takes for arthritis). He mentions that he drove to his work was standing felt flushed and fell. Was taken subsequently to the hospital for further evaluation. The problem happened insidiously and has resolved. He has had this problem before and has had work up as outpatient but has not been told why he is experiencing this new problem. Nothing makes it worse that he is aware of. He denies any palpitations prior to fall. Also denies any seizure like activity or loss of consciousness. Mentions that initially they noticed that he was having difficulty with his speech  but has resolved since the problem occurred.    Hospital Course:  1. TIA -  Work up with echocardiogram and carotid dopplers negative - No abnormalities reported while on telemetry  - due to metallic implants patient will not be able to obtain mri for further imaging  - Echocardiogram reviewed and showed grade 1 diastolic dysfunction - No evidence of stroke and work up showed patient had orthostatic hypotension initially which resolved with IVF's and rehydration. - Physical Therapy to evaluate  - Orthostatic blood pressure checks showed a 20 degree change in systolic blood pressure initially.  Which has resolved with IVF's and decreased opiod and benzodiazepine regimen. - Patient mentions that he takes xanax daily. I think this may be contributing to his weakness and as a result will plan on cutting the dose.  -Also will decrease dose of ambien at nights.   - Recommended that patient follow up with his primary care physician as his opiod and benzodiazepine regimen may be contributing to his.   - Will recommend that he take his blood pressure in the mornings daily and hold his opiods if his systolic is < 100 pt verbalizes his agreement and understanding.  2. COPD  - Stable will continue home regimen   3. Hypothyroidism  - continue home regimen  - Have patient repeat TSH level as outpatient and have his PCP adjust medication as needed pending results.  4. Arthritis  - Continue dilaudid at lower dose as outpatient with parameters for holding medication.  May use extra strength tylenol for discomfort. -- stable  currently.   5. Depression  - Continue home regimen  - Stable. Reportedly he does not take zoloft everyday   6. GERD  - continue home regimen  - stable currently   Procedures:  CT of head  Echocardiogram  Carotid doppler  Consultations:  Physical therapy  Discharge Exam: Filed Vitals:   08/24/12 0659 08/24/12 0700 08/24/12 0838 08/24/12 1130  BP: 118/69 107/92   131/67  Pulse: 77 77  98  Temp:    98 F (36.7 C)  TempSrc:    Oral  Resp:    18  Height:      Weight:      SpO2:   95% 95%    General: Pt in NAD, A and O x 3 Cardiovascular: RRR, No MRG, no carotid bruits Respiratory: CTA BL, no wheezes  Discharge Instructions  Discharge Orders    Future Orders Please Complete By Expires   Diet - low sodium heart healthy      Increase activity slowly      Discharge instructions      Comments:   Please stop taking your xanax.  Also I have decreased your dilaudid and ambien medication.   You are to check your blood pressures in the AM and hold your dilaudid if your systolic blood pressure is < 100.  I want you to check your blood pressures daily log it and discuss with your primary care physician.   Call MD for:  temperature >100.4      Call MD for:  persistant nausea and vomiting      Call MD for:  persistant dizziness or light-headedness      Call MD for:  extreme fatigue        Medication List  As of 08/24/2012  2:27 PM   STOP taking these medications         ALPRAZolam 0.5 MG tablet         TAKE these medications         albuterol 108 (90 BASE) MCG/ACT inhaler   Commonly known as: PROVENTIL HFA;VENTOLIN HFA   Inhale 2 puffs into the lungs every 6 (six) hours as needed. Shortness of breath      aspirin 81 MG tablet   Take 81 mg by mouth daily.      beclomethasone 80 MCG/ACT inhaler   Commonly known as: QVAR   Inhale 2 puffs into the lungs daily.      bromocriptine 2.5 MG tablet   Commonly known as: PARLODEL   Take 1 tablet (2.5 mg total) by mouth at bedtime.      cyclobenzaprine 10 MG tablet   Commonly known as: FLEXERIL   Take 10 mg by mouth 3 (three) times daily as needed. For muscle spasms      HYDROmorphone 2 MG tablet   Commonly known as: DILAUDID   Take 3 tablets (6 mg total) by mouth every 6 (six) hours as needed for pain.      levothyroxine 25 MCG tablet   Commonly known as: SYNTHROID, LEVOTHROID   Take 25 mcg  by mouth at bedtime.      omeprazole 20 MG tablet   Commonly known as: PRILOSEC OTC   Take 20 mg by mouth daily.      predniSONE 10 MG tablet   Commonly known as: DELTASONE   Take 1 tablet (10 mg total) by mouth daily.      sertraline 100 MG tablet   Commonly known as: ZOLOFT   Take  1 tablet (100 mg total) by mouth daily.      simvastatin 20 MG tablet   Commonly known as: ZOCOR   Take 1 tablet (20 mg total) by mouth at bedtime.      tiotropium 18 MCG inhalation capsule   Commonly known as: SPIRIVA   Place 18 mcg into inhaler and inhale daily.      venlafaxine XR 75 MG 24 hr capsule   Commonly known as: EFFEXOR-XR   Take 75 mg by mouth daily.      zolpidem 5 MG tablet   Commonly known as: AMBIEN   Take 1 tablet (5 mg total) by mouth at bedtime as needed. For sleep              The results of significant diagnostics from this hospitalization (including imaging, microbiology, ancillary and laboratory) are listed below for reference.    Significant Diagnostic Studies: Ct Head Wo Contrast  08/23/2012  *RADIOLOGY REPORT*  Clinical Data: Near-syncope.  Dizziness.  Weakness.  CT HEAD WITHOUT CONTRAST  Technique:  Contiguous axial images were obtained from the base of the skull through the vertex without contrast.  Comparison: 05/07/2012  Findings: There is no evidence for acute hemorrhage, hydrocephalus, mass lesion, or abnormal extra-axial fluid collection.  No definite CT evidence for acute infarction.  Diffuse loss of parenchymal volume is consistent with atrophy. Patchy low attenuation in the deep hemispheric and periventricular white matter is nonspecific, but likely reflects chronic microvascular ischemic demyelination.  The visualized paranasal sinuses and mastoid air cells are clear.  IMPRESSION: Stable.  No acute intracranial abnormality.  Atrophy with chronic small vessel white matter ischemic demyelination.   Original Report Authenticated By: ERIC A. MANSELL, M.D.    Dg  Chest Portable 1 View  08/23/2012  *RADIOLOGY REPORT*  Clinical Data: Cough, congestion, shortness of breath  PORTABLE CHEST - 1 VIEW  Comparison: 07/09/2012  Findings: Cardiomegaly with mild interstitial edema. Bibasilar opacities, likely atelectasis. No pleural effusion or pneumothorax.  Thoracic spine stimulator.  Surgical clips overlying the GE junction / upper abdomen.  IMPRESSION: Cardiomegaly with mild interstitial edema.  Bibasilar opacities, likely atelectasis.   Original Report Authenticated By: Charline Bills, M.D.     Microbiology: No results found for this or any previous visit (from the past 240 hour(s)).   Labs: Basic Metabolic Panel:  Lab 08/23/12 1610  NA 138  K 4.4  CL 105  CO2 22  GLUCOSE 112*  BUN 19  CREATININE 1.04  CALCIUM 9.0  MG --  PHOS --   Liver Function Tests:  Lab 08/23/12 0953  AST 24  ALT 33  ALKPHOS 72  BILITOT 0.4  PROT 6.3  ALBUMIN 3.7   No results found for this basename: LIPASE:5,AMYLASE:5 in the last 168 hours No results found for this basename: AMMONIA:5 in the last 168 hours CBC:  Lab 08/23/12 0953  WBC 13.9*  NEUTROABS 11.8*  HGB 13.3  HCT 39.8  MCV 96.8  PLT 219   Cardiac Enzymes: No results found for this basename: CKTOTAL:5,CKMB:5,CKMBINDEX:5,TROPONINI:5 in the last 168 hours BNP: BNP (last 3 results) No results found for this basename: PROBNP:3 in the last 8760 hours CBG:  Lab 08/24/12 1211 08/24/12 0749 08/23/12 2142 08/23/12 1914  GLUCAP 126* 107* 96 88    Time coordinating discharge: > 35 minutes  Signed:  Penny Pia  [facility] 08/24/2012, 2:27 PM  Addendum: Discussed case with physical therapist and they recommend outpatient physical therapy which I will provide written script  for.

## 2012-08-24 NOTE — Progress Notes (Signed)
VASCULAR LAB PRELIMINARY  PRELIMINARY  PRELIMINARY  PRELIMINARY  Carotid Dopplers completed.    Preliminary report:  There is no ICA stenosis.  Vertebral artery flow is antegrade.  Molly Savarino, 08/24/2012, 10:03 AM

## 2012-08-24 NOTE — Progress Notes (Signed)
  Echocardiogram 2D Echocardiogram has been performed.  Dakota [name] 08/24/2012, 10:02 AM

## 2012-08-26 ENCOUNTER — Encounter: Payer: Medicare Other | Admitting: Physical Therapy

## 2012-08-27 ENCOUNTER — Telehealth: Payer: Self-pay | Admitting: Internal Medicine

## 2012-08-27 NOTE — Telephone Encounter (Signed)
Please call the patient, needs an appointment next week for a followup on recent hospitalization

## 2012-08-28 ENCOUNTER — Encounter: Payer: Medicare Other | Admitting: Physical Therapy

## 2012-08-29 NOTE — Telephone Encounter (Signed)
Thank you :)

## 2012-08-29 NOTE — Telephone Encounter (Signed)
Lmovm for pt to call office and schedule OV.

## 2012-08-29 NOTE — Telephone Encounter (Signed)
Coming in wed 9.18.13 3pm due to his work sch. He must have Monday or wed only either early in the am or late in the PM

## 2012-09-02 ENCOUNTER — Encounter: Payer: Medicare Other | Admitting: Physical Therapy

## 2012-09-04 ENCOUNTER — Encounter: Payer: Self-pay | Admitting: Internal Medicine

## 2012-09-04 ENCOUNTER — Encounter: Payer: Medicare Other | Admitting: Physical Therapy

## 2012-09-04 ENCOUNTER — Ambulatory Visit (INDEPENDENT_AMBULATORY_CARE_PROVIDER_SITE_OTHER): Payer: Medicare Other | Admitting: Internal Medicine

## 2012-09-04 VITALS — BP 128/74 | HR 75 | Temp 97.5°F | Wt 147.0 lb

## 2012-09-04 DIAGNOSIS — N400 Enlarged prostate without lower urinary tract symptoms: Secondary | ICD-10-CM

## 2012-09-04 DIAGNOSIS — E039 Hypothyroidism, unspecified: Secondary | ICD-10-CM

## 2012-09-04 DIAGNOSIS — M542 Cervicalgia: Secondary | ICD-10-CM

## 2012-09-04 DIAGNOSIS — M353 Polymyalgia rheumatica: Secondary | ICD-10-CM

## 2012-09-04 DIAGNOSIS — F329 Major depressive disorder, single episode, unspecified: Secondary | ICD-10-CM

## 2012-09-04 DIAGNOSIS — E291 Testicular hypofunction: Secondary | ICD-10-CM

## 2012-09-04 LAB — POCT URINALYSIS DIPSTICK
Bilirubin, UA: NEGATIVE
Nitrite, UA: POSITIVE
Protein, UA: 100
Urobilinogen, UA: 0.2
pH, UA: 6

## 2012-09-04 MED ORDER — CIPROFLOXACIN HCL 500 MG PO TABS: 500.0000 mg | ORAL_TABLET | Freq: Two times a day (BID) | ORAL | Status: DC

## 2012-09-04 NOTE — Assessment & Plan Note (Addendum)
Mild dysuria since he left the hospital, symptoms started after they placed a Foley catheter on him.  UA ++ blood, will check a UCX, start empiric cipro.

## 2012-09-04 NOTE — Assessment & Plan Note (Addendum)
Was admitted to the hospital with extreme fatigue, on looking back the patient thinks part of the problem was related to the lost of his brother prior to admission. At this point he's off Xanax per hospitalist advise and sleeping  "okay" consequently we'll not restart Xanax. He is on Zoloft 100 mg and Effexor. Previously we discussed to discontinue Effexor Plan: Discontinue Effexor Increase Zoloft 150 mg daily. ambien dose was decreased to 5 mg, continue with a low dose

## 2012-09-04 NOTE — Assessment & Plan Note (Signed)
Last TSH slightly elevated, since then the patient has increasing to 2 one tablet twice a day. Recheck a TSH in 6 weeks when he comes back for a checkup

## 2012-09-04 NOTE — Assessment & Plan Note (Signed)
Started bromocriptine last month for hypogonadism, since then he is feeling bad, weak, dizzy. He self discontinued that medication after he left the hospital few days ago. Plan: To stay off bromocriptine for now

## 2012-09-04 NOTE — Assessment & Plan Note (Signed)
Patient was prescribed prednisone by rheumatology, he already finished the tendon. Off prednisone at this point and feeling better

## 2012-09-04 NOTE — Assessment & Plan Note (Signed)
On  Hydromorphone, recommend to discuss dose with pain management

## 2012-09-04 NOTE — Progress Notes (Signed)
  Subjective:    Patient ID: Dakota [name], male    DOB: 1933-11-03, 78 y.o.   MRN: [id number]  HPI  Hospital followup Admitted for one day, discharge 08/24/2012 He was admitted with the provisional dx  of TIA (extremely weak, eventually "collapsed"), echocardiogram showed grade 1 diastolic dysfunction and  carotid Dopplers negative, telemetry normal. CT head without acute abnormalities, chest x-ray showed mild cardiomegaly and some opacities likely atelectasis He had some decreased BPs orthostatically, better after  IV fluids. He also complained of weakness, hospitalist felt to be related to Xanax, pain meds  or Ambien, they recommend to decrease the dose of Ambien and d/c xanax   Labs reviewed: BMP normal, triglycerides 216, A1c 5.8, CBC normal except for white count 13.9, TSH is slightly elevated at 5.9.   Past Medical History: Chronic neck pain and HA, DX w/ cervical stenosis GERD COPD Hyperlipidemia Peptic ulcer disease Benign prostatic hypertrophy Depression C-scope 02/2001--2--diverticulosis, internal hemorrhoids, and some hyperplastic folds in his sigmoid area that were benign.   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 In retrospect, he thinks he was feeling poorly because bromocriptine which was a started last month and also because he lost a brother  08/18/2012. Since he left the hospital, no further collapse, denies chest pain, shortness of breath or edema. Still feeling weak mostly in the morning but definitely better than before. We went over his medication list, he is off Xanax as recommended by the hospitalist and sleeping "okay", he is on Zoloft, ambien and Effexor  Also, his TSH was slightly elevated and he increased Synthroid from 1 tablet daily to 1 tablet twice a day. Reports that a Foley catheter was placed while he was in the hospital, since then he has mild dysuria. Denies any gross hematuria or abdominal pain.     Objective:   Physical Exam  General -- alert, well-developed, and well-nourished.   HEENT -- not pale  Lungs -- normal respiratory effort, no intercostal retractions, no accessory muscle use, and normal breath sounds.   Heart-- normal rate, regular rhythm, no murmur, and no gallop.   Abdomen--soft, non-tender, no distention, no masses, no HSM, no guarding, and no rigidity.   Extremities-- no pretibial edema bilaterally Psych-- Cognition and judgment appear intact. Alert and cooperative with normal attention span and concentration.  Emotionally, he does not seem depressed, he actually seems more up beat than usual.      Assessment & Plan:   As far as the admission  for fatigue and collapse, he seems to be better. See below.

## 2012-09-04 NOTE — Patient Instructions (Addendum)
Stop Effexor Sertraline, increase to 1.5 tablets daily (150 mg a day) Please come back in 6 weeks Start ciprofloxacin 500 mg twice a day for a UTI, drink plenty of fluids.

## 2012-09-05 ENCOUNTER — Other Ambulatory Visit: Payer: Self-pay | Admitting: Internal Medicine

## 2012-09-05 ENCOUNTER — Other Ambulatory Visit: Payer: Self-pay

## 2012-09-05 MED ORDER — SIMVASTATIN 20 MG PO TABS: 20.0000 mg | ORAL_TABLET | Freq: Every day | ORAL | Status: DC

## 2012-09-05 MED ORDER — SERTRALINE HCL 100 MG PO TABS: 150.0000 mg | ORAL_TABLET | Freq: Every day | ORAL | Status: DC

## 2012-09-05 MED ORDER — CYCLOBENZAPRINE HCL 10 MG PO TABS: 10.0000 mg | ORAL_TABLET | Freq: Three times a day (TID) | ORAL | Status: DC | PRN

## 2012-09-05 MED ORDER — ZOLPIDEM TARTRATE 5 MG PO TABS: 5.0000 mg | ORAL_TABLET | Freq: Every evening | ORAL | Status: DC | PRN

## 2012-09-05 MED ORDER — LEVOTHYROXINE SODIUM 25 MCG PO TABS: 25.0000 ug | ORAL_TABLET | Freq: Two times a day (BID) | ORAL | Status: DC

## 2012-09-05 NOTE — Telephone Encounter (Signed)
pt states medications was not sent yesterday for anything but Antibiotic--pt said he requested his medications be sent in yesterday with his antibiotic as he was out of them--if you call home # and he is not there please call cell at [phone]

## 2012-09-05 NOTE — Telephone Encounter (Signed)
Okay Flexeril #60 , no RF okay Ambien #30 and 3 refills

## 2012-09-05 NOTE — Telephone Encounter (Signed)
Ok to refill [employer] & flexeril?

## 2012-09-07 LAB — URINE CULTURE

## 2012-09-09 ENCOUNTER — Encounter: Payer: Medicare Other | Admitting: Physical Therapy

## 2012-09-11 ENCOUNTER — Encounter: Payer: Medicare Other | Admitting: Physical Therapy

## 2012-10-04 ENCOUNTER — Other Ambulatory Visit: Payer: Self-pay | Admitting: Internal Medicine

## 2012-10-04 NOTE — Telephone Encounter (Signed)
Refill done.  

## 2012-10-15 ENCOUNTER — Ambulatory Visit: Payer: Medicare Other | Admitting: Internal Medicine

## 2012-10-15 DIAGNOSIS — Z0289 Encounter for other administrative examinations: Secondary | ICD-10-CM

## 2012-12-26 ENCOUNTER — Other Ambulatory Visit: Payer: Self-pay | Admitting: Internal Medicine

## 2012-12-26 NOTE — Telephone Encounter (Signed)
OK to refill? Last OV 9.18.13.

## 2012-12-27 ENCOUNTER — Encounter: Payer: Self-pay | Admitting: *Deleted

## 2012-12-27 NOTE — Telephone Encounter (Signed)
done

## 2012-12-31 ENCOUNTER — Other Ambulatory Visit: Payer: Self-pay | Admitting: Internal Medicine

## 2012-12-31 NOTE — Telephone Encounter (Signed)
This prescription was filled on 1.9.14. The prescription is still at our front desk for the pt to come by to pick up & sign controlled substance contract. I have left a detailed msg for the pt's vmail.

## 2013-02-13 ENCOUNTER — Encounter: Payer: Self-pay | Admitting: Internal Medicine

## 2013-02-19 ENCOUNTER — Telehealth: Payer: Self-pay | Admitting: Internal Medicine

## 2013-02-19 NOTE — Telephone Encounter (Signed)
Advise patient, he is due for a checkup.  Will refill his medication.

## 2013-02-19 NOTE — Telephone Encounter (Signed)
Ok to refill? Last OV 9.18.13 Last filled 1.9.14

## 2013-02-20 NOTE — Telephone Encounter (Signed)
Discussed with pt. Scheduled appt 4.2.14.  Faxed rx.

## 2013-03-19 ENCOUNTER — Ambulatory Visit (INDEPENDENT_AMBULATORY_CARE_PROVIDER_SITE_OTHER): Payer: Medicare Other | Admitting: Internal Medicine

## 2013-03-19 ENCOUNTER — Encounter: Payer: Self-pay | Admitting: Internal Medicine

## 2013-03-19 VITALS — BP 128/76 | HR 73 | Temp 97.9°F | Wt 147.0 lb

## 2013-03-19 DIAGNOSIS — N390 Urinary tract infection, site not specified: Secondary | ICD-10-CM

## 2013-03-19 DIAGNOSIS — E291 Testicular hypofunction: Secondary | ICD-10-CM

## 2013-03-19 DIAGNOSIS — J449 Chronic obstructive pulmonary disease, unspecified: Secondary | ICD-10-CM

## 2013-03-19 DIAGNOSIS — E039 Hypothyroidism, unspecified: Secondary | ICD-10-CM

## 2013-03-19 DIAGNOSIS — F329 Major depressive disorder, single episode, unspecified: Secondary | ICD-10-CM

## 2013-03-19 LAB — CBC WITH DIFFERENTIAL/PLATELET
Basophils Absolute: 0.1 10*3/uL (ref 0.0–0.1)
Lymphocytes Relative: 31.3 % (ref 12.0–46.0)
Lymphs Abs: 2.4 10*3/uL (ref 0.7–4.0)
Monocytes Relative: 8.9 % (ref 3.0–12.0)
Neutrophils Relative %: 55.6 % (ref 43.0–77.0)
Platelets: 245 10*3/uL (ref 150.0–400.0)
RDW: 13.7 % (ref 11.5–14.6)

## 2013-03-19 LAB — URINALYSIS, ROUTINE W REFLEX MICROSCOPIC
Bilirubin Urine: NEGATIVE
Ketones, ur: NEGATIVE
Leukocytes, UA: NEGATIVE
Specific Gravity, Urine: 1.025 (ref 1.000–1.030)
Total Protein, Urine: NEGATIVE
Urine Glucose: NEGATIVE
pH: 6 (ref 5.0–8.0)

## 2013-03-19 LAB — TSH: TSH: 4.75 u[IU]/mL (ref 0.35–5.50)

## 2013-03-19 MED ORDER — TIOTROPIUM BROMIDE MONOHYDRATE 18 MCG IN CAPS: ORAL_CAPSULE | RESPIRATORY_TRACT | Status: DC

## 2013-03-19 NOTE — Assessment & Plan Note (Addendum)
Ongoing severe depression,PHQ 9 today #21, fortunately he is not suicidal. Depression has been very hard control likely d/t comorbidities including DJD, chronic pain, dyspnea on exertion, hypogonadism, etc Plan: D/c xanax , making him sleepy Refer to Dr Donell Beers Unable to treat hypogonadism at this point

## 2013-03-19 NOTE — Assessment & Plan Note (Addendum)
increased synthroid to 2 tabs, labs

## 2013-03-19 NOTE — Patient Instructions (Signed)
Stop xanax, is making you sleepy

## 2013-03-19 NOTE — Assessment & Plan Note (Signed)
Recent documented UTI, s/p cipro, asx, check a UCX

## 2013-03-19 NOTE — Assessment & Plan Note (Addendum)
Untreated. Will pursue a pulmonary and psych eval first, then consider re-eval by endocrinology

## 2013-03-19 NOTE — Progress Notes (Signed)
  Subjective:    Patient ID: Dakota [name], male    DOB: 05-Jun-1933, 79 y.o.   MRN: [id number]  HPI Routine office visit. Since the last time he was here, he is doing about the same, not feeling well, see review of systems. Depression, this is ongoing problem. He is back taking Xanax, he takes it as needed at night. Although his sx are severe, see below, he denies feeling suicidal. COPD, needs a refill on the Spiriva, continue with dyspnea on exertion, no cough. Hypothyroidism, Synthroid dose increased to 2 tablets daily, good compliance. Had a UTI, status post Cipro.  Past Medical History  Diagnosis Date  . Chronic neck pain     and HA, dx w/ cervical stenosis  . GERD (gastroesophageal reflux disease)   . COPD (chronic obstructive pulmonary disease)   . Hyperlipidemia   . Peptic ulcer disease   . BPH (benign prostatic hypertrophy)   . Depression   . CAD (coronary artery disease)     per cath: mild/non obstructive 03/2006  . Osteopenia   . 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 Pulmonary nodule     s/p Ct x 2 last 05/2008: scarring  . Gallbladder & bile duct stone     per [place] 2007   Past Surgical History  Procedure Laterality Date  . Arthoscopic rotaor cuff repair      L-2003, R-2004 Dr Ranell Patrick  . Inguinal hernia repair      r  . Ulcer surgery  1978  . Spinal cord stimulator insertion  5/11    lower back     Review of Systems Continue complaining of extreme fatigue. DJD, causing severe pain, under pain management. No fever or chills. No dysuria, gross hematuria or difficulty urinating.     Objective:   Physical Exam General -- alert, well-developed .   Neck --no thyromegaly , normal carotid pulse, no LADs Lungs -- normal respiratory effort, no intercostal retractions, no accessory muscle use, and decreased breath sounds.   Heart-- normal rate, regular rhythm, no murmur, and no gallop.   Neurologic-- alert & oriented X3 and strength normal in all  extremities. Psych-- no distress,PHQ 9 scored 21     Assessment & Plan:

## 2013-03-19 NOTE — Assessment & Plan Note (Addendum)
Ongoing dyspnea on exertion and severe fatigue; although fatigue is likely multifactorial, I wonder if pulmonary could help the patient. O2sat past walking: 94% Plan: Cont present meds PFTs Refer to pulmonary , further COPD treatment? And also  r/o OSA? (he reports snoring)

## 2013-03-20 LAB — URINE CULTURE
Colony Count: NO GROWTH
Organism ID, Bacteria: NO GROWTH

## 2013-03-24 ENCOUNTER — Telehealth: Payer: Self-pay | Admitting: *Deleted

## 2013-03-24 MED ORDER — LEVOTHYROXINE SODIUM 75 MCG PO TABS: 75.0000 ug | ORAL_TABLET | Freq: Every day | ORAL | Status: DC

## 2013-03-24 NOTE — Telephone Encounter (Signed)
Pt called requesting a refill for levothyroxine & ambien 5mg . OK to refill?  Last OV 4.2.14 Last filled 9.9.13

## 2013-03-24 NOTE — Telephone Encounter (Signed)
Rx sent for synthroid Left message to call office to advise Pt of lab results and change in med   Notes Recorded by Wanda Plump, MD on 03/24/2013 at 2:15 PM Advise pt:  needs more synthroid, change from 25 mcg 2 tabs qd to ----> synthroid 75 mcg 1 po qd , call #[phone], 3 RF Arrange a TSH in 6 weeks--- dx hypothyroidism Other labs wnl

## 2013-03-25 ENCOUNTER — Telehealth (HOSPITAL_COMMUNITY): Payer: Self-pay

## 2013-03-25 NOTE — Telephone Encounter (Signed)
lmovm for pt to return call.  

## 2013-03-25 NOTE — Telephone Encounter (Signed)
3:43PM 03/25/13 CALLED LEFT MSG DUE TO REFERRAL FOR NP -WAITING FOR CALL BACK/SH

## 2013-03-25 NOTE — Telephone Encounter (Signed)
Please call one more time to be sure he got the message

## 2013-03-26 NOTE — Telephone Encounter (Signed)
Pt aware of labs results 

## 2013-03-27 ENCOUNTER — Encounter (HOSPITAL_COMMUNITY): Payer: Medicare Other

## 2013-04-01 ENCOUNTER — Institutional Professional Consult (permissible substitution): Payer: Medicare Other | Admitting: Internal Medicine

## 2013-05-01 ENCOUNTER — Telehealth: Payer: Self-pay | Admitting: General Practice

## 2013-05-01 ENCOUNTER — Other Ambulatory Visit: Payer: Self-pay | Admitting: General Practice

## 2013-05-01 ENCOUNTER — Other Ambulatory Visit: Payer: Self-pay | Admitting: Internal Medicine

## 2013-05-01 NOTE — Telephone Encounter (Signed)
Xanax refill request received today. Per last OV on 4/2 this med was discontinued due to making pt sleepy. Please advise if pt should have this med.

## 2013-05-01 NOTE — Telephone Encounter (Signed)
Not listed on pt's current med list. OK to refill? Last OV 4.2.14

## 2013-05-01 NOTE — Telephone Encounter (Signed)
Xanax was discontinued on early April because he was getting sleepy, he was referred to Dr. Donell Beers. Needs to see him please .

## 2013-07-21 ENCOUNTER — Encounter: Payer: Self-pay | Admitting: Internal Medicine

## 2013-07-21 ENCOUNTER — Ambulatory Visit (INDEPENDENT_AMBULATORY_CARE_PROVIDER_SITE_OTHER): Payer: Medicare Other | Admitting: Internal Medicine

## 2013-07-21 VITALS — BP 150/90 | HR 82 | Temp 98.2°F | Wt 147.0 lb

## 2013-07-21 DIAGNOSIS — N400 Enlarged prostate without lower urinary tract symptoms: Secondary | ICD-10-CM

## 2013-07-21 DIAGNOSIS — J449 Chronic obstructive pulmonary disease, unspecified: Secondary | ICD-10-CM

## 2013-07-21 DIAGNOSIS — E039 Hypothyroidism, unspecified: Secondary | ICD-10-CM

## 2013-07-21 DIAGNOSIS — E785 Hyperlipidemia, unspecified: Secondary | ICD-10-CM

## 2013-07-21 DIAGNOSIS — R739 Hyperglycemia, unspecified: Secondary | ICD-10-CM | POA: Insufficient documentation

## 2013-07-21 DIAGNOSIS — K219 Gastro-esophageal reflux disease without esophagitis: Secondary | ICD-10-CM

## 2013-07-21 DIAGNOSIS — N402 Nodular prostate without lower urinary tract symptoms: Secondary | ICD-10-CM

## 2013-07-21 DIAGNOSIS — R7303 Prediabetes: Secondary | ICD-10-CM

## 2013-07-21 DIAGNOSIS — N390 Urinary tract infection, site not specified: Secondary | ICD-10-CM

## 2013-07-21 DIAGNOSIS — F329 Major depressive disorder, single episode, unspecified: Secondary | ICD-10-CM

## 2013-07-21 DIAGNOSIS — R634 Abnormal weight loss: Secondary | ICD-10-CM

## 2013-07-21 DIAGNOSIS — R7309 Other abnormal glucose: Secondary | ICD-10-CM

## 2013-07-21 LAB — COMPREHENSIVE METABOLIC PANEL
ALT: 27 U/L (ref 0–53)
AST: 33 U/L (ref 0–37)
Albumin: 4.2 g/dL (ref 3.5–5.2)
Alkaline Phosphatase: 69 U/L (ref 39–117)
Potassium: 4.1 mEq/L (ref 3.5–5.1)
Sodium: 140 mEq/L (ref 135–145)
Total Bilirubin: 0.7 mg/dL (ref 0.3–1.2)
Total Protein: 7.1 g/dL (ref 6.0–8.3)

## 2013-07-21 LAB — LIPID PANEL
Cholesterol: 198 mg/dL (ref 0–200)
LDL Cholesterol: 125 mg/dL — ABNORMAL HIGH (ref 0–99)
Total CHOL/HDL Ratio: 5
VLDL: 34.8 mg/dL (ref 0.0–40.0)

## 2013-07-21 LAB — HEMOGLOBIN A1C: Hgb A1c MFr Bld: 6.1 % (ref 4.6–6.5)

## 2013-07-21 NOTE — Assessment & Plan Note (Signed)
See "BPH"

## 2013-07-21 NOTE — Assessment & Plan Note (Signed)
Urine culture 03-19-2013 negative

## 2013-07-21 NOTE — Assessment & Plan Note (Signed)
A1c 5.8 last year, labs

## 2013-07-21 NOTE — Assessment & Plan Note (Addendum)
One-month history of difficulty swallowing fluids, history of GERD, no heartburn. Plan: GI referral, EGD?

## 2013-07-21 NOTE — Assessment & Plan Note (Addendum)
Saw urology in 2011, not reviewed: History prostate nodule since 1985, status post biopsy. Also history of SCC Penis. No further testing was suggested at that time, I don't think he has been seen again by urology since 2011. Plan: Reassess on return to this office

## 2013-07-21 NOTE — Assessment & Plan Note (Signed)
Reports good compliance with medications, labs

## 2013-07-21 NOTE — Assessment & Plan Note (Signed)
Wt is a  147 pounds stable at

## 2013-07-21 NOTE — Assessment & Plan Note (Signed)
Failed to see psychiatry. Symptoms are stable, not suicidal. Reports he is not taking Xanax Plan: List of providers in the area recommended, Recommend to see psychiatry

## 2013-07-21 NOTE — Patient Instructions (Addendum)
Please call us when ready to see a pulmonary doctor for a referral. I recommended to see a psychiatrist. Please review the list of providers in this area. Next visit in 2 months, schedule a yearly checkup, 30 minute visit.

## 2013-07-21 NOTE — Assessment & Plan Note (Signed)
Reports good compliance with medication,labs

## 2013-07-21 NOTE — Progress Notes (Signed)
  Subjective:    Patient ID: Dakota [name], male    DOB: 1933/09/20, 79 y.o.   MRN: [id number]  HPI ROV COPD, good compliance with Spiriva, not taking Qvar or albuterol, dyspnea on exertion at baseline.Did not see pulmonary. Hypothyroidism, good compliance w/ medication. Depression, did not see Dr. Jasmine Awe, mood about the same. Reports he is not taking Xanax. GERD, reports dysphagia to solids for one month, on Prilosec which he takes regularly.  Past Medical History  Diagnosis Date  . Chronic neck pain     and HA, dx w/ cervical stenosis  . GERD (gastroesophageal reflux disease)   . COPD (chronic obstructive pulmonary disease)   . Hyperlipidemia   . Peptic ulcer disease   . BPH (benign prostatic hypertrophy)   . Depression   . CAD (coronary artery disease)     per cath: mild/non obstructive 03/2006  . Osteopenia   . 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 Pulmonary nodule     s/p Ct x 2 last 05/2008: scarring  . Gallbladder & bile duct stone     per [place] 2007   Past Surgical History  Procedure Laterality Date  . Arthoscopic rotaor cuff repair      L-2003, R-2004 Dr Ranell Patrick  . Inguinal hernia repair      r  . Ulcer surgery  1978  . Spinal cord stimulator insertion  5/11    lower back   History   Social History  . Marital Status: Married    Spouse Name: N/A    Number of Children: 2  . Years of Education: N/A   Occupational History  . 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 Smoking status: Former Games developer  . Smokeless tobacco: Never Used  . Alcohol Use: Yes     Comment: denies daily intake, 1 or 2 drinkis (x5/week(  . Drug Use: Not on file  . Sexually Active: No   Other Topics Concern  . Not on file   Social History Narrative   Lives w/ wife   daugther is medical PA           Review of Systems No recent weight loss. No cough or hemoptysis. No nausea, vomiting. No odynophagia. No problems swallowing fluids or   tablets. Denies suicidal ideas.    Objective:   Physical Exam BP 150/90  Pulse 82  Temp(Src) 98.2 F (36.8 C) (Oral)  Wt 147 lb (66.679 kg)  BMI 26.05 kg/m2  SpO2 94% General -- alert, well-developed,NAD Lungs -- normal respiratory effort, no intercostal retractions, no accessory muscle use, and decreased breath sounds.   Heart-- normal rate, regular rhythm, no murmur, and no gallop.   Extremities-- no pretibial edema bilaterally Psych-- Cognition and judgment appear intact. Alert and cooperative with normal attention span and concentration.  not anxious appearing, mildly  depressed appearing But at baseline.      Assessment & Plan:

## 2013-07-21 NOTE — Assessment & Plan Note (Signed)
As her last office visit, he failed to see pulmonary. I don't see PFTs. Currently only on Spiriva, DOE at baseline. Plan: Continue Spiriva, encouraged to take Qvar, patient to let me know when ready to see pulmonary

## 2013-07-22 ENCOUNTER — Encounter: Payer: Self-pay | Admitting: Gastroenterology

## 2013-07-24 ENCOUNTER — Ambulatory Visit: Payer: Medicare Other | Admitting: Gastroenterology

## 2013-07-25 ENCOUNTER — Telehealth: Payer: Self-pay | Admitting: Internal Medicine

## 2013-07-25 MED ORDER — ATORVASTATIN CALCIUM 20 MG PO TABS: ORAL_TABLET | ORAL | Status: DC

## 2013-07-25 NOTE — Telephone Encounter (Signed)
Discussed with patient, verbalized understanding. Pt. Advised to stop simvastatin when he starts atorvastatin. Verbalized understanding.

## 2013-07-25 NOTE — Telephone Encounter (Signed)
Message copied by Shirlee More I on Fri Jul 25, 2013  9:16 AM ------      Message from: [name] E      Created: Thu Jul 24, 2013  3:55 PM       Advise patient:      Cholesterol used to be better, Change simvastatin 20 mg to  atorvastatin 20 mg one by mouth each bedtime #30 and 5 refills.Will check labs when he comes back in 2 months for a physical.      Has prediabetes, that's stable.      Continue with same dose of Synthroid.       ------

## 2013-07-25 NOTE — Telephone Encounter (Signed)
Patient says that he is returning a missed call from yesterday in regards to his lab results.

## 2013-08-06 ENCOUNTER — Encounter: Payer: Self-pay | Admitting: *Deleted

## 2013-08-08 ENCOUNTER — Ambulatory Visit: Payer: Medicare Other | Admitting: Gastroenterology

## 2013-09-24 ENCOUNTER — Other Ambulatory Visit: Payer: Self-pay | Admitting: Internal Medicine

## 2013-09-24 NOTE — Telephone Encounter (Signed)
Levothyroxine refill sent to pharmacy 

## 2013-10-26 IMAGING — CT CT HEAD W/O CM
2 series · 16 of 30 positions shown, 20 images · non-contrast
Comparison: 05/07/2012

CLINICAL DATA: Near-syncope.  Dizziness.  Weakness.

CT HEAD WITHOUT CONTRAST
TECHNIQUE: Contiguous axial images were obtained from the base of
the skull through the vertex without contrast.

[Series 2: head w/o · axial · non-contrast · 0.43mm/px · z∈[+1487,+1612]mm · 13 of 31 slices shown, 17 images]
[im 3/31  brain]
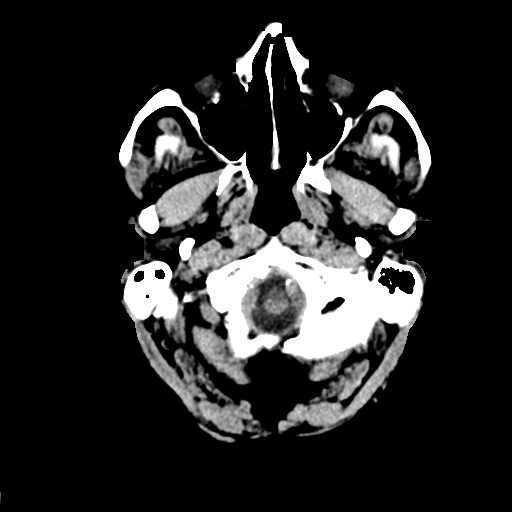
[im 3/31  bone]
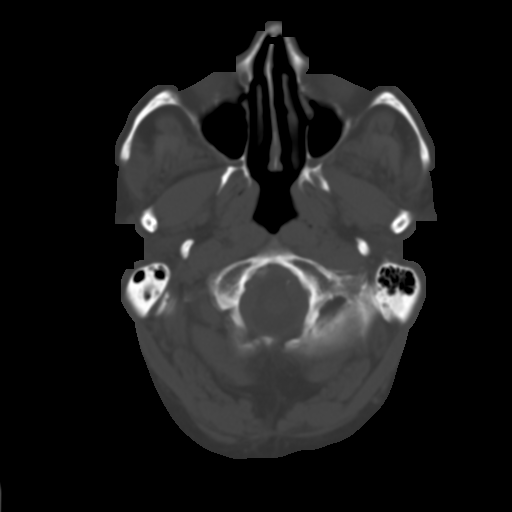
[im 5/31  brain]
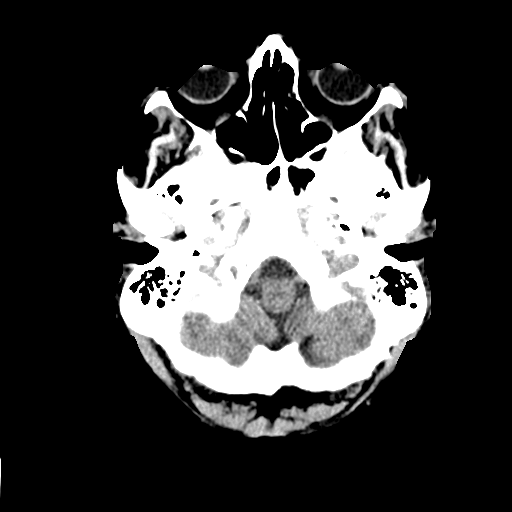
[im 7/31  brain]
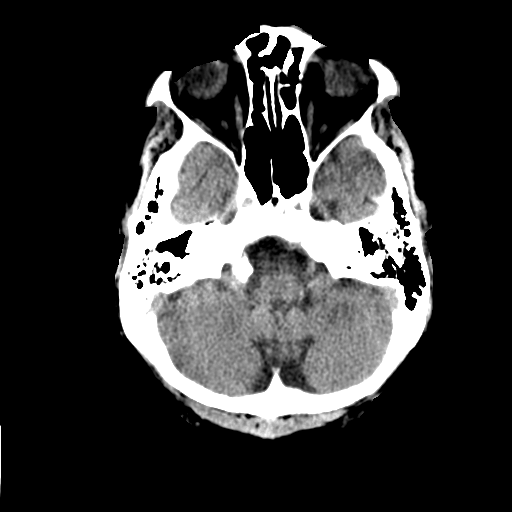
[im 9/31  brain]
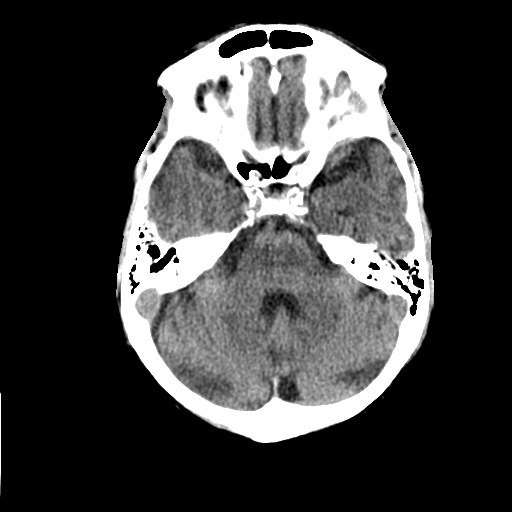
[im 11/31  brain]
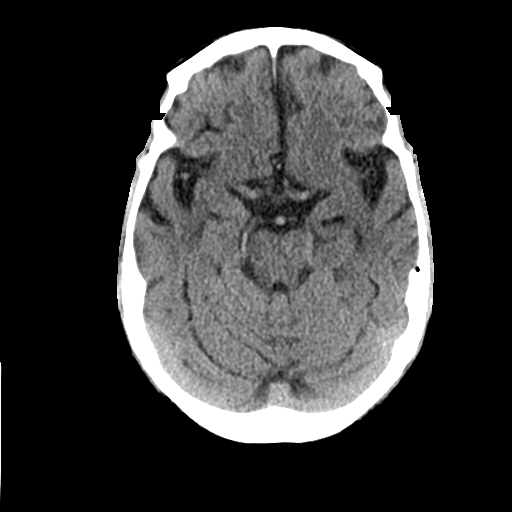
[im 11/31  bone]
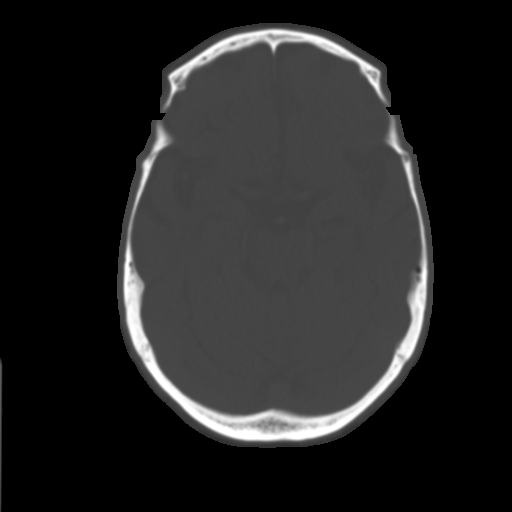
[im 13/31  brain]
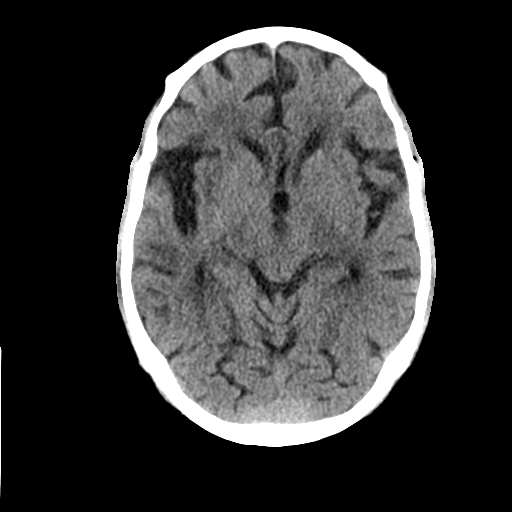
[im 16/31  brain]
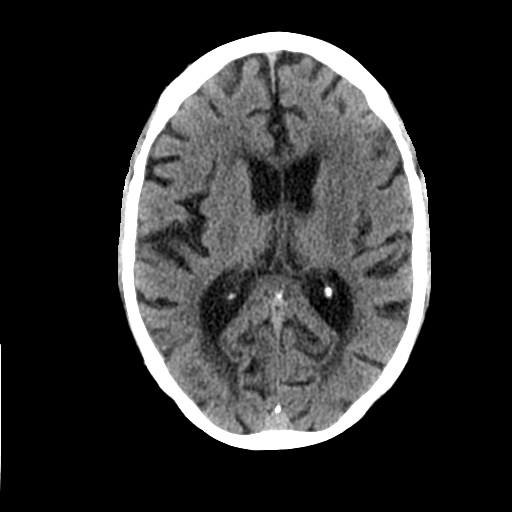
[im 18/31  brain]
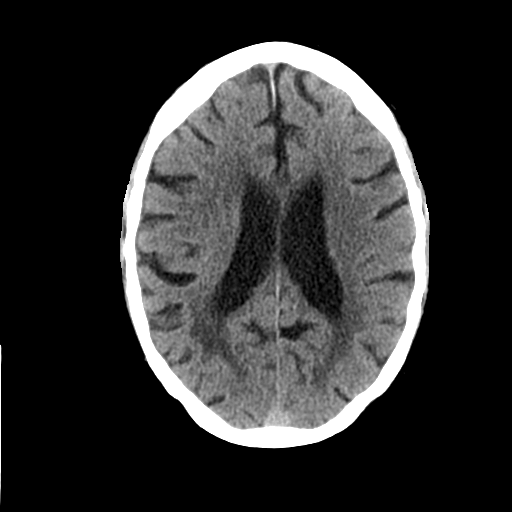
[im 20/31  brain]
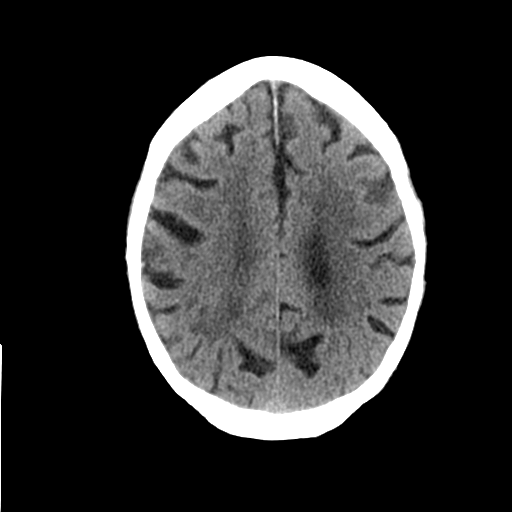
[im 20/31  bone]
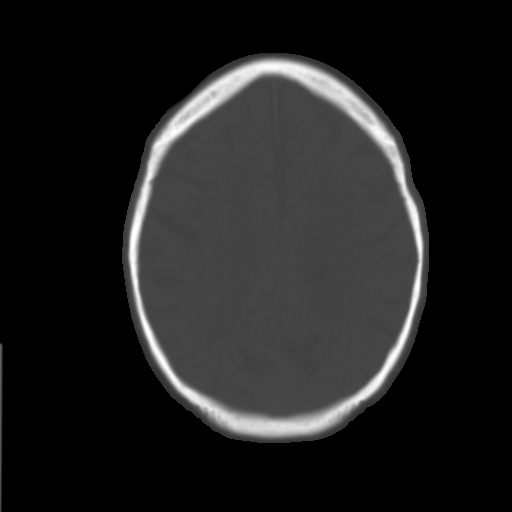
[im 22/31  brain]
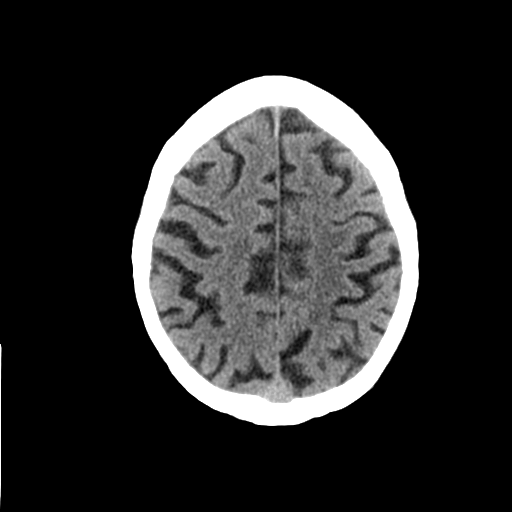
[im 24/31  brain]
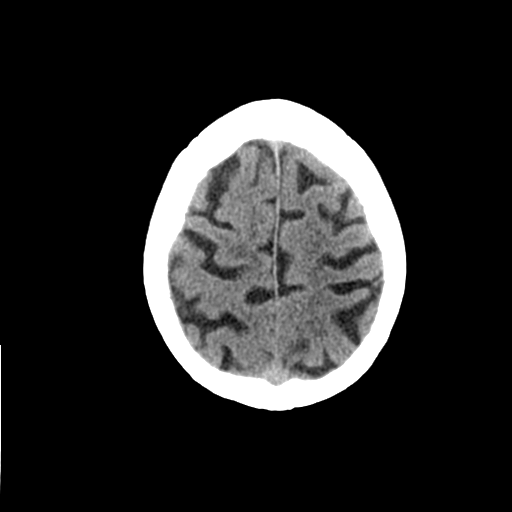
[im 26/31  brain]
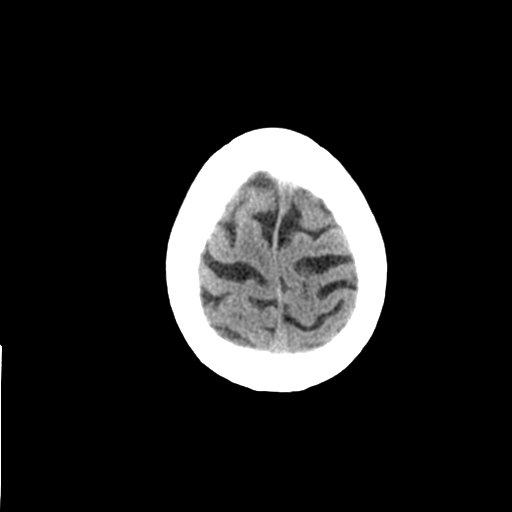
[im 28/31  brain]
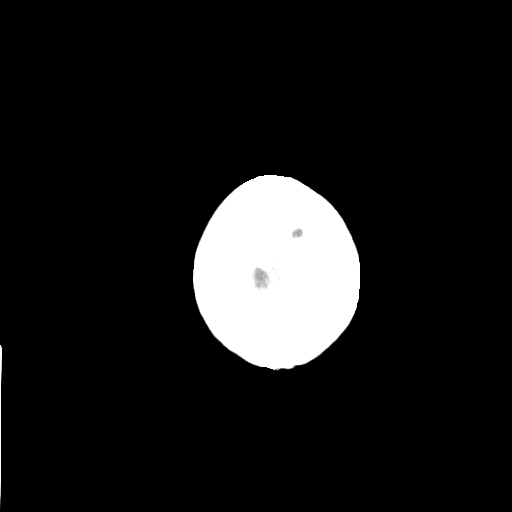
[im 28/31  bone]
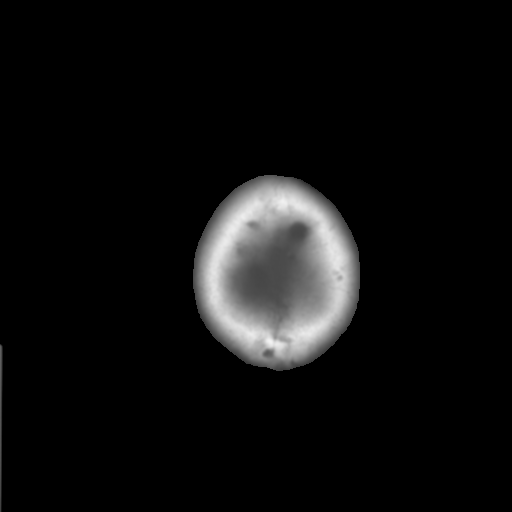

[Series 3: bone windows · axial · 0.43mm/px · z∈[+1487,+1527]mm · 3 of 31 slices shown]
[im 3/31  bone]
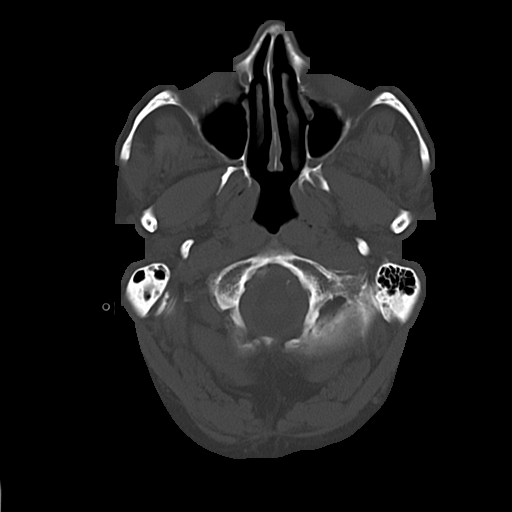
[im 7/31  bone]
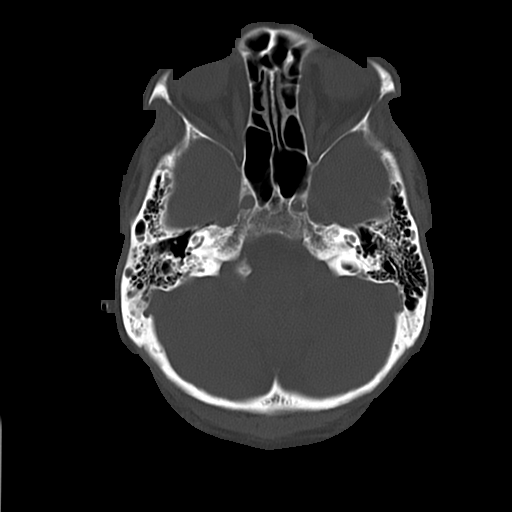
[im 11/31  bone]
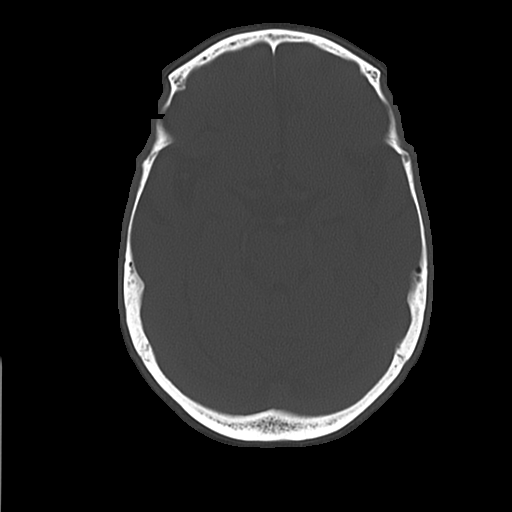

[16 of 30 positions shown; findings below may reference images not displayed]

FINDINGS: There is no evidence for acute hemorrhage, hydrocephalus,
mass lesion, or abnormal extra-axial fluid collection.  No definite
CT evidence for acute infarction.  Diffuse loss of parenchymal
volume is consistent with atrophy. Patchy low attenuation in the
deep hemispheric and periventricular white matter is nonspecific,
but likely reflects chronic microvascular ischemic demyelination.

The visualized paranasal sinuses and mastoid air cells are clear.
IMPRESSION: Stable.  No acute intracranial abnormality.

Atrophy with chronic small vessel white matter ischemic
demyelination.

## 2013-10-26 IMAGING — CR DG CHEST 1V PORT
1 series · 1 of 1 positions shown · non-contrast
Comparison: 07/09/2012

CLINICAL DATA: Cough, congestion, shortness of breath

PORTABLE CHEST - 1 VIEW

[AP]
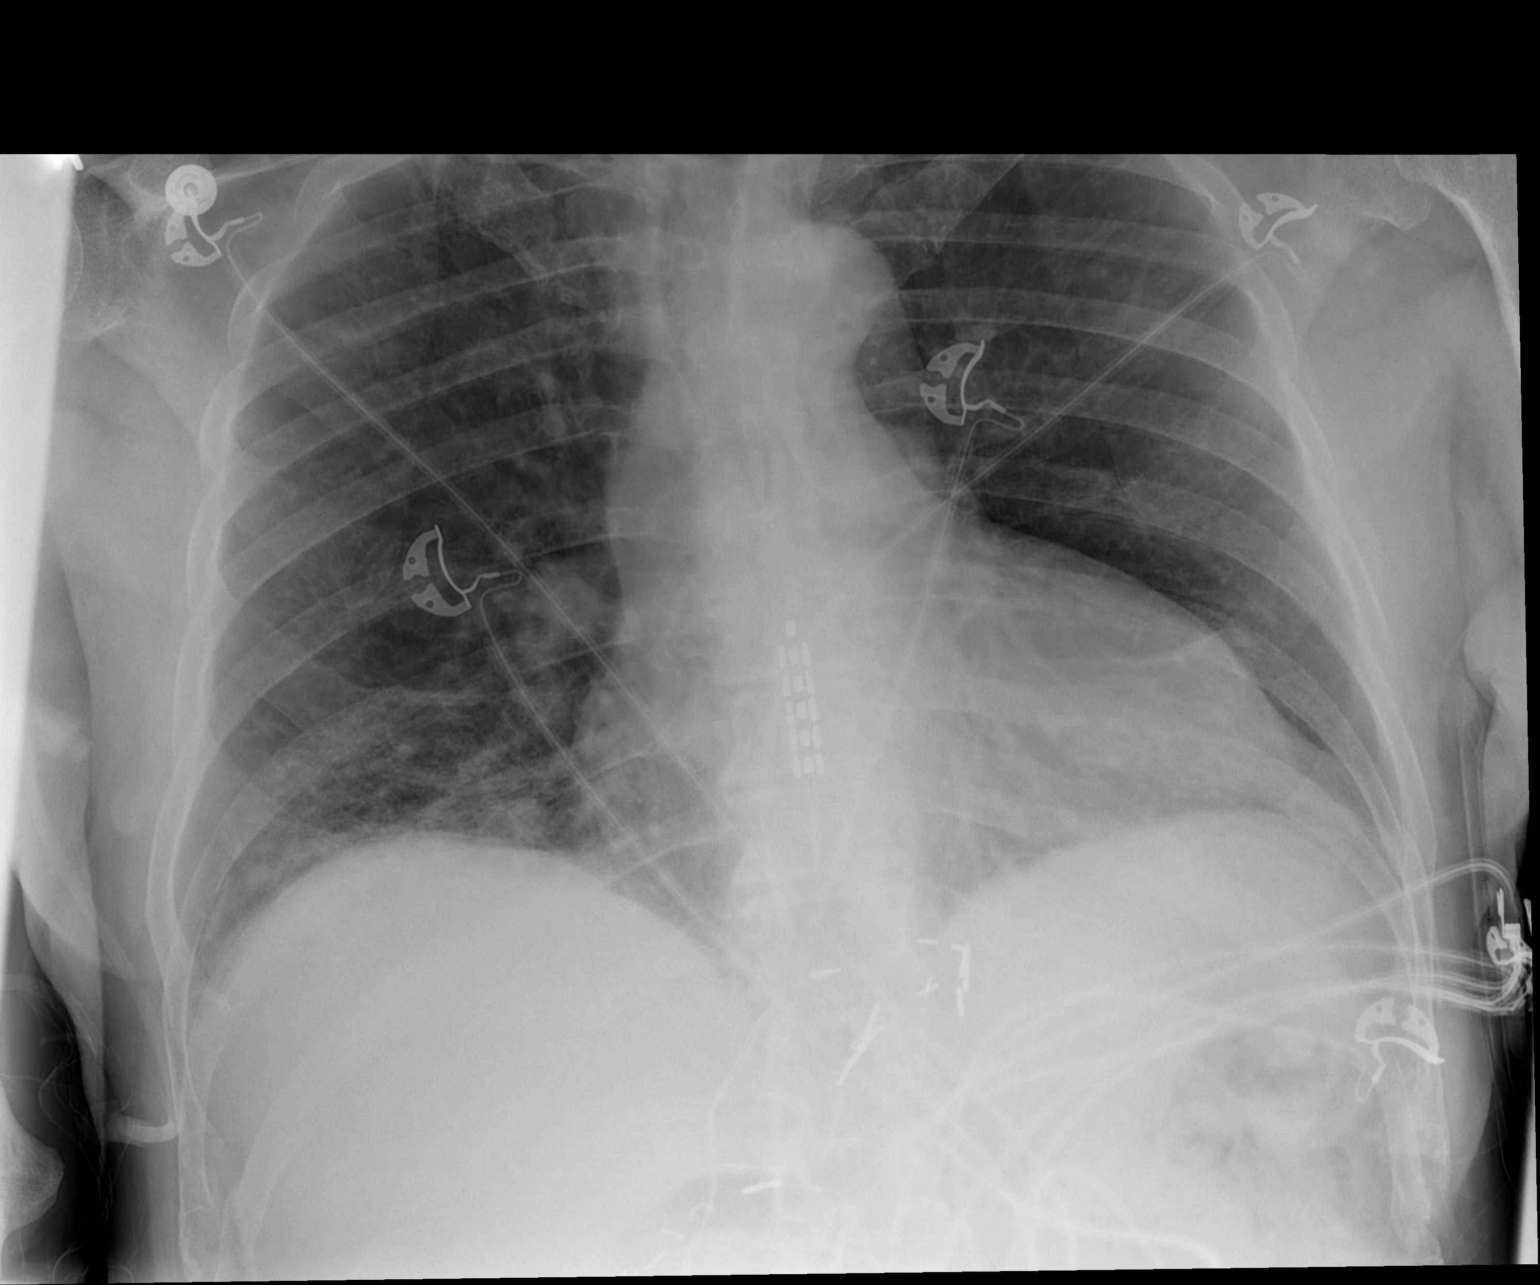

[1 of 1 positions shown; findings below may reference images not displayed]

FINDINGS: Cardiomegaly with mild interstitial edema. Bibasilar
opacities, likely atelectasis. No pleural effusion or pneumothorax.

Thoracic spine stimulator.  Surgical clips overlying the GE
junction / upper abdomen.
IMPRESSION: Cardiomegaly with mild interstitial edema.

Bibasilar opacities, likely atelectasis.

## 2014-02-06 ENCOUNTER — Ambulatory Visit: Payer: Medicare Other | Admitting: Internal Medicine

## 2014-02-12 ENCOUNTER — Ambulatory Visit: Payer: Medicare Other | Admitting: Internal Medicine

## 2014-02-16 ENCOUNTER — Ambulatory Visit (INDEPENDENT_AMBULATORY_CARE_PROVIDER_SITE_OTHER): Payer: Medicare Other | Admitting: Internal Medicine

## 2014-02-16 ENCOUNTER — Encounter: Payer: Self-pay | Admitting: Internal Medicine

## 2014-02-16 ENCOUNTER — Telehealth: Payer: Self-pay | Admitting: *Deleted

## 2014-02-16 VITALS — BP 128/73 | HR 78 | Temp 97.9°F | Wt 143.0 lb

## 2014-02-16 DIAGNOSIS — R634 Abnormal weight loss: Secondary | ICD-10-CM

## 2014-02-16 DIAGNOSIS — E559 Vitamin D deficiency, unspecified: Secondary | ICD-10-CM

## 2014-02-16 DIAGNOSIS — J449 Chronic obstructive pulmonary disease, unspecified: Secondary | ICD-10-CM

## 2014-02-16 DIAGNOSIS — F329 Major depressive disorder, single episode, unspecified: Secondary | ICD-10-CM

## 2014-02-16 DIAGNOSIS — E291 Testicular hypofunction: Secondary | ICD-10-CM

## 2014-02-16 DIAGNOSIS — F3289 Other specified depressive episodes: Secondary | ICD-10-CM

## 2014-02-16 DIAGNOSIS — M353 Polymyalgia rheumatica: Secondary | ICD-10-CM

## 2014-02-16 DIAGNOSIS — E785 Hyperlipidemia, unspecified: Secondary | ICD-10-CM

## 2014-02-16 DIAGNOSIS — E039 Hypothyroidism, unspecified: Secondary | ICD-10-CM

## 2014-02-16 LAB — CBC WITH DIFFERENTIAL/PLATELET
BASOS PCT: 0.6 % (ref 0.0–3.0)
Basophils Absolute: 0 10*3/uL (ref 0.0–0.1)
EOS ABS: 0.2 10*3/uL (ref 0.0–0.7)
Eosinophils Relative: 2.2 % (ref 0.0–5.0)
HCT: 40.9 % (ref 39.0–52.0)
HEMOGLOBIN: 13.4 g/dL (ref 13.0–17.0)
LYMPHS PCT: 24 % (ref 12.0–46.0)
Lymphs Abs: 1.9 10*3/uL (ref 0.7–4.0)
MCHC: 32.8 g/dL (ref 30.0–36.0)
MCV: 98.4 fl (ref 78.0–100.0)
Monocytes Absolute: 0.5 10*3/uL (ref 0.1–1.0)
Monocytes Relative: 6.7 % (ref 3.0–12.0)
NEUTROS ABS: 5.3 10*3/uL (ref 1.4–7.7)
Neutrophils Relative %: 66.5 % (ref 43.0–77.0)
Platelets: 273 10*3/uL (ref 150.0–400.0)
RBC: 4.16 Mil/uL — AB (ref 4.22–5.81)
RDW: 14.3 % (ref 11.5–14.6)
WBC: 8 10*3/uL (ref 4.5–10.5)

## 2014-02-16 LAB — COMPREHENSIVE METABOLIC PANEL
ALBUMIN: 3.9 g/dL (ref 3.5–5.2)
ALK PHOS: 64 U/L (ref 39–117)
ALT: 19 U/L (ref 0–53)
AST: 25 U/L (ref 0–37)
BUN: 14 mg/dL (ref 6–23)
CALCIUM: 8.8 mg/dL (ref 8.4–10.5)
CO2: 26 meq/L (ref 19–32)
Chloride: 106 mEq/L (ref 96–112)
Creatinine, Ser: 1.1 mg/dL (ref 0.4–1.5)
GFR: 71.71 mL/min (ref >60.00)
Glucose, Bld: 86 mg/dL (ref 70–99)
POTASSIUM: 4.3 meq/L (ref 3.5–5.1)
SODIUM: 139 meq/L (ref 135–145)
TOTAL PROTEIN: 6.5 g/dL (ref 6.0–8.3)
Total Bilirubin: 0.7 mg/dL (ref 0.3–1.2)

## 2014-02-16 LAB — LIPID PANEL
CHOLESTEROL: 156 mg/dL (ref 0–200)
HDL: 55 mg/dL (ref >39.00)
LDL Cholesterol: 89 mg/dL (ref 0–99)
Total CHOL/HDL Ratio: 3
Triglycerides: 60 mg/dL (ref 0.0–149.0)
VLDL: 12 mg/dL (ref 0.0–40.0)

## 2014-02-16 LAB — SEDIMENTATION RATE: Sed Rate: 7 mm/hr (ref 0–22)

## 2014-02-16 MED ORDER — ALBUTEROL SULFATE HFA 108 (90 BASE) MCG/ACT IN AERS: 2.0000 | INHALATION_SPRAY | Freq: Four times a day (QID) | RESPIRATORY_TRACT | Status: DC | PRN

## 2014-02-16 NOTE — Assessment & Plan Note (Signed)
At some point Rx prednisone by rheumatology, Although his quite fatigue, he denies headaches or myalgias per se. Will check a sedimentation rate

## 2014-02-16 NOTE — Assessment & Plan Note (Signed)
untreated, could be contributing to fatigue. Check testosterone levels

## 2014-02-16 NOTE — Assessment & Plan Note (Addendum)
Currently on Synthroid 25 mcg 1 tablet twice a day, had "side effects" with Synthroid 75 mcg qd . Plan: labs

## 2014-02-16 NOTE — Assessment & Plan Note (Signed)
Currently on Lipitor, labs

## 2014-02-16 NOTE — Telephone Encounter (Signed)
Message copied by Eustace QuailEABOLD, Bach Rocchi J on Mon Feb 16, 2014  4:24 PM ------      Message from: Willow OraPAZ, JOSE E      Created: Mon Feb 16, 2014 12:11 PM      Regarding: [facility]        [facility], needs a 24 h oxymetry, dx COPC ------

## 2014-02-16 NOTE — Assessment & Plan Note (Addendum)
Currently on spiriva only, has daily cough, rare wheezing. COPD definitely could be contributing to his fatigue. PFTs never gone to my knowledge Plan: PFTs, 24 h pulse oximeter, refill albuterol

## 2014-02-16 NOTE — Assessment & Plan Note (Addendum)
Lost only 3 pounds in the last few months. He is complaining of extreme fatigue, likely multifactorial: Hypogonadism, COPD; also has a history of vitamin D deficiency. ?  EF preserved  CHF---> Last echocardiogram was 08-2012, EF was within normal, had grade 1 diastolic dysfunction. Plan: Labs, reassess in one month

## 2014-02-16 NOTE — Patient Instructions (Signed)
Get your blood work before you leave   Next visit is for routine check up   in 4 weeks  No need to come back fasting Please make an appointment

## 2014-02-16 NOTE — Assessment & Plan Note (Signed)
Currently on Effexor only ---> reports  symptoms are controlled

## 2014-02-16 NOTE — Progress Notes (Signed)
Subjective:    Patient ID: Dakota [name], male    DOB: 10/29/1933, 80 y.o.   MRN: [id number]  DOS:  02/16/2014 Type of  visit: Acute visit .  His main concern today is generalized fatigue, lack of energy. Also weight loss, per our scales he does have minimal weight loss. He also has sweats and decreased appetite. He is currently on Effexor only and reports his mood is well-controlled. He had a hard time describing-remembering the list of medicines he takes.     ROS Fever -- denies  No  CP . No palpitations, no lower extremity edema No orthopnea   + DOE after 100 yards  Denies  nausea, vomiting diarrhea Denies  blood in the stools Cough in AM , + sputum production, yellow  No daily wheezing  Denies suicidal ideas  No headaches,  No myalgias per see (++ DJD sx)     Past Medical History  Diagnosis Date  . Chronic neck pain     and HA, dx w/ cervical stenosis  . GERD (gastroesophageal reflux disease)   . COPD (chronic obstructive pulmonary disease)   . Hyperlipidemia   . Peptic ulcer disease   . BPH (benign prostatic hypertrophy)   . Depression   . CAD (coronary artery disease)     per cath: mild/non obstructive 03/2006  . Osteopenia   . 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 Pulmonary nodule     s/p Ct x 2 last 05/2008: scarring  . Gallbladder & bile duct stone     per [place] 2007  . Diverticulosis of colon (without mention of hemorrhage)   . Hyperplastic colon polyp     Past Surgical History  Procedure Laterality Date  . Arthoscopic rotaor cuff repair      L-2003, R-2004 Dr Ranell Patrick  . Inguinal hernia repair      r  . Ulcer surgery  1978  . Spinal cord stimulator insertion  5/11    lower back    History   Social History  . Marital Status: Married    Spouse Name: N/A    Number of Children: 2  . Years of Education: N/A   Occupational History  . 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 Smoking status: Former Games developer    . Smokeless tobacco: Never Used     Comment: 2011 aprox  . Alcohol Use: Yes     Comment: denies daily intake, 1 or 2 drinkis (x5/week(  . Drug Use: Not on file  . Sexual Activity: No   Other Topics Concern  . Not on file   Social History Narrative   Lives w/ wife   daugther is medical PA              Medication List       This list is accurate as of: 02/16/14 12:10 PM.  Always use your most recent med list.               albuterol 108 (90 BASE) MCG/ACT inhaler  Commonly known as:  PROVENTIL HFA;VENTOLIN HFA  Inhale 2 puffs into the lungs every 6 (six) hours as needed. Shortness of breath     ALPRAZolam 0.5 MG tablet  Commonly known as:  XANAX     atorvastatin 20 MG tablet  Commonly known as:  LIPITOR  One by mouth daily each bedtime.     cyclobenzaprine  10 MG tablet  Commonly known as:  FLEXERIL  Take 1 tablet (10 mg total) by mouth 3 (three) times daily as needed. For muscle spasms     HYDROmorphone 8 MG tablet  Commonly known as:  DILAUDID     levothyroxine 25 MCG tablet  Commonly known as:  SYNTHROID, LEVOTHROID  TAKE 1 TABLET BY MOUTH TWICE DAILY     omeprazole 20 MG tablet  Commonly known as:  PRILOSEC OTC  Take 20 mg by mouth daily.     tiotropium 18 MCG inhalation capsule  Commonly known as:  SPIRIVA HANDIHALER  INHALE 1 CAPSULE VIA HANDIHALER ONCE DAILY AT THE SAME TIME EACH DAY     venlafaxine XR 75 MG 24 hr capsule  Commonly known as:  EFFEXOR-XR     zolpidem 5 MG tablet  Commonly known as:  AMBIEN  Take 1 tablet (5 mg total) by mouth at bedtime as needed. For sleep           Objective:   Physical Exam BP 128/73  Pulse 78  Temp(Src) 97.9 F (36.6 C)  Wt 143 lb (64.864 kg)  SpO2 94% General -- alert, well-developed, NAD.  Neck --no thyromegaly , No JVD at 45 HEENT-- Not pale.   Lungs -- normal respiratory effort, no intercostal retractions, no accessory muscle use, and normal breath sounds.  Heart-- normal rate, regular  rhythm, no murmur.  Abdomen-- Not distended, good bowel sounds,soft, non-tender.  Extremities-- no pretibial edema bilaterally  Neurologic--  alert & oriented X3. Speech normal, gait normal, strength normal in all extremities.  Psych-- Cognition and judgment appear intact. Cooperative with normal attention span and concentration. No anxious or depressed appearing.      Assessment & Plan:

## 2014-02-16 NOTE — Telephone Encounter (Signed)
Ordered. [facility] to [facility]

## 2014-02-17 ENCOUNTER — Telehealth: Payer: Self-pay | Admitting: Internal Medicine

## 2014-02-17 LAB — TSH: TSH: 6.38 u[IU]/mL — ABNORMAL HIGH (ref 0.35–5.50)

## 2014-02-17 LAB — TESTOSTERONE, FREE, TOTAL, SHBG
Sex Hormone Binding: 54 nmol/L (ref 13–71)
TESTOSTERONE: 226 ng/dL — AB (ref 300–890)
Testosterone, Free: 31.3 pg/mL — ABNORMAL LOW (ref 47.0–244.0)
Testosterone-% Free: 1.4 % — ABNORMAL LOW (ref 1.6–2.9)

## 2014-02-17 LAB — VITAMIN D 25 HYDROXY (VIT D DEFICIENCY, FRACTURES): VIT D 25 HYDROXY: 23 ng/mL — AB (ref 30–89)

## 2014-02-17 NOTE — Telephone Encounter (Signed)
Relevant patient education mailed to patient.  

## 2014-02-19 ENCOUNTER — Other Ambulatory Visit: Payer: Self-pay | Admitting: Internal Medicine

## 2014-02-19 DIAGNOSIS — I2510 Atherosclerotic heart disease of native coronary artery without angina pectoris: Secondary | ICD-10-CM

## 2014-02-19 MED ORDER — BROMOCRIPTINE MESYLATE 0.8 MG PO TABS: 1.0000 | ORAL_TABLET | Freq: Every day | ORAL | Status: DC

## 2014-02-20 ENCOUNTER — Encounter: Payer: Self-pay | Admitting: *Deleted

## 2014-02-20 MED ORDER — VITAMIN D (ERGOCALCIFEROL) 1.25 MG (50000 UNIT) PO CAPS: 50000.0000 [IU] | ORAL_CAPSULE | ORAL | Status: DC

## 2014-02-20 MED ORDER — LEVOTHYROXINE SODIUM 50 MCG PO TABS: 50.0000 ug | ORAL_TABLET | Freq: Every day | ORAL | Status: DC

## 2014-02-20 NOTE — Addendum Note (Signed)
Addended by: Eustace QuailEABOLD, Sirenia Whitis J on: 02/20/2014 01:37 PM   Modules accepted: Orders

## 2014-02-20 NOTE — Addendum Note (Signed)
Addended by: Eustace QuailEABOLD, Pearl Berlinger J on: 02/20/2014 08:39 AM   Modules accepted: Orders

## 2014-02-26 ENCOUNTER — Telehealth: Payer: Self-pay

## 2014-02-26 NOTE — Telephone Encounter (Signed)
Patient called to question bromocriptine that was sent. Pharmacist told him it was for DM. Medication is also used for testosterone levels. Was prescribed for patient in this capacity per Dr Drue NovelPaz.  Advised and he now understands it better. Will follow up as directed.

## 2014-05-02 ENCOUNTER — Other Ambulatory Visit: Payer: Self-pay | Admitting: Internal Medicine

## 2014-05-08 ENCOUNTER — Encounter: Payer: Self-pay | Admitting: Internal Medicine

## 2014-05-08 ENCOUNTER — Ambulatory Visit (INDEPENDENT_AMBULATORY_CARE_PROVIDER_SITE_OTHER): Payer: Medicare Other | Admitting: Internal Medicine

## 2014-05-08 VITALS — BP 125/72 | HR 86 | Temp 97.8°F | Wt 141.0 lb

## 2014-05-08 DIAGNOSIS — R634 Abnormal weight loss: Secondary | ICD-10-CM

## 2014-05-08 DIAGNOSIS — J449 Chronic obstructive pulmonary disease, unspecified: Secondary | ICD-10-CM

## 2014-05-08 DIAGNOSIS — E559 Vitamin D deficiency, unspecified: Secondary | ICD-10-CM | POA: Insufficient documentation

## 2014-05-08 DIAGNOSIS — E039 Hypothyroidism, unspecified: Secondary | ICD-10-CM

## 2014-05-08 DIAGNOSIS — E291 Testicular hypofunction: Secondary | ICD-10-CM

## 2014-05-08 DIAGNOSIS — M353 Polymyalgia rheumatica: Secondary | ICD-10-CM

## 2014-05-08 LAB — TSH: TSH: 3.76 u[IU]/mL (ref 0.35–4.50)

## 2014-05-08 MED ORDER — AMOXICILLIN 500 MG PO CAPS: 1000.0000 mg | ORAL_CAPSULE | Freq: Two times a day (BID) | ORAL | Status: DC

## 2014-05-08 NOTE — Assessment & Plan Note (Signed)
Last sedimentation rate 7.0

## 2014-05-08 NOTE — Assessment & Plan Note (Addendum)
His weight is stable at home, per our scales has lost  2 pounds. In general he feels better compared to last visit, I wonder if he feels better because he is taking a higher dose of Synthroid , vit d and/or bromocriptine. Plan: Reassess in 3 months.

## 2014-05-08 NOTE — Progress Notes (Signed)
Subjective:    Patient ID: Dakota [name], male    DOB: 10/25/1933, 80 y.o.   MRN: [id number]  DOS:  05/08/2014 Type of  visit: followup from previous visit COPD, PFTs and pulse oximeter not done, see assessment and plan Hypothyroidism, Synthroid dose adjusted, good compliance Low vitamin D, on supplements 2 weeks history of nose, head congestion, feeling stopped up. + Cough with yellow sputum. Taking Mucinex. Weight loss, night sweats: In general feels better, weight loss stable, still has night sweats.    ROS Denies fever or chills No N-V-D No chest pain or difficulty breathing Mild wheezing, at baseline. No hemoptysis.   Past Medical History  Diagnosis Date  . Chronic neck pain     and HA, dx w/ cervical stenosis  . GERD (gastroesophageal reflux disease)   . COPD (chronic obstructive pulmonary disease)   . Hyperlipidemia   . Peptic ulcer disease   . BPH (benign prostatic hypertrophy)   . Depression   . CAD (coronary artery disease)     per cath: mild/non obstructive 03/2006  . Osteopenia   . 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 Pulmonary nodule     s/p Ct x 2 last 05/2008: scarring  . Gallbladder & bile duct stone     per [place] 2007  . Diverticulosis of colon (without mention of hemorrhage)   . Hyperplastic colon polyp     Past Surgical History  Procedure Laterality Date  . Arthoscopic rotaor cuff repair      L-2003, R-2004 Dr Ranell PatrickNorris  . Inguinal hernia repair      r  . Ulcer surgery  1978  . Spinal cord stimulator insertion  5/11    lower back    History   Social History  . Marital Status: Married    Spouse Name: N/A    Number of Children: 2  . Years of Education: N/A   Occupational History  . 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 Smoking status: Former Games developermoker  . Smokeless tobacco: Never Used     Comment: 2011 aprox  . Alcohol Use: Yes     Comment: denies daily intake, 1 or 2 drinkis (x5/week(  . Drug  Use: Not on file  . Sexual Activity: No   Other Topics Concern  . Not on file   Social History Narrative   Lives w/ wife   daugther is medical PA              Medication List       This list is accurate as of: 05/08/14 11:59 PM.  Always use your most recent med list.               albuterol 108 (90 BASE) MCG/ACT inhaler  Commonly known as:  PROVENTIL HFA;VENTOLIN HFA  Inhale 2 puffs into the lungs every 6 (six) hours as needed. Shortness of breath     ALPRAZolam 0.5 MG tablet  Commonly known as:  XANAX     amoxicillin 500 MG capsule  Commonly known as:  AMOXIL  Take 2 capsules (1,000 mg total) by mouth 2 (two) times daily.     atorvastatin 20 MG tablet  Commonly known as:  LIPITOR  TAKE 1 TABLET BY MOUTH AT BEDTIME     Bromocriptine Mesylate 0.8 MG Tabs  Take 1 tablet (0.8 mg total) by mouth daily.     cyclobenzaprine  10 MG tablet  Commonly known as:  FLEXERIL  Take 1 tablet (10 mg total) by mouth 3 (three) times daily as needed. For muscle spasms     HYDROmorphone 8 MG tablet  Commonly known as:  DILAUDID     levothyroxine 50 MCG tablet  Commonly known as:  SYNTHROID, LEVOTHROID  Take 1 tablet (50 mcg total) by mouth daily.     omeprazole 20 MG tablet  Commonly known as:  PRILOSEC OTC  Take 20 mg by mouth daily.     tiotropium 18 MCG inhalation capsule  Commonly known as:  SPIRIVA HANDIHALER  INHALE 1 CAPSULE VIA HANDIHALER ONCE DAILY AT THE SAME TIME EACH DAY     venlafaxine XR 75 MG 24 hr capsule  Commonly known as:  EFFEXOR-XR     Vitamin D (Ergocalciferol) 50000 UNITS Caps capsule  Commonly known as:  DRISDOL  Take 1 capsule (50,000 Units total) by mouth every 7 (seven) days.     zolpidem 5 MG tablet  Commonly known as:  AMBIEN  Take 1 tablet (5 mg total) by mouth at bedtime as needed. For sleep           Objective:   Physical Exam BP 125/72  Pulse 86  Temp(Src) 97.8 F (36.6 C)  Wt 141 lb (63.957 kg)  SpO2 93% General -- alert,  well-developed, NAD. Seems in general better than last time he was here, more energetic, smiling . HEENT-- Not pale. TMs normal, throat symmetric, no redness or discharge. Face symmetric, sinuses not tender to palpation. Nose  congested.  Lungs -- normal respiratory effort, no intercostal retractions, no accessory muscle use, and few ronchi Heart-- normal rate, regular rhythm, no murmur.   Extremities-- no pretibial edema bilaterally  Neurologic--  alert & oriented X3.   Psych-- Cognition and judgment appear intact. Cooperative with normal attention span and concentration. No anxious or depressed appearing.        Assessment & Plan:

## 2014-05-08 NOTE — Assessment & Plan Note (Signed)
Dose was adjusted to basal last TSH, recheck a TSH

## 2014-05-08 NOTE — Assessment & Plan Note (Signed)
Testosterone was very low, now taking bromocriptine, Labs

## 2014-05-08 NOTE — Patient Instructions (Signed)
Get your blood work before you leave   Rest, fluids , tylenol For cough, take Mucinex DM twice a day as needed  For congestion use OTC Nasocort: 2 nasal sprays on each side of the nose daily until you feel better Take the antibiotic as prescribed  (Amoxicillin) Call if no better in few days Call anytime if the symptoms are severe      Next visit is for routine check up in 3 months , no need to be fasting

## 2014-05-08 NOTE — Progress Notes (Signed)
Pre visit review using our clinic review tool, if applicable. No additional management support is needed unless otherwise documented below in the visit note. 

## 2014-05-08 NOTE — Assessment & Plan Note (Addendum)
Since the last time he was here, did not have PFTs, appointment had to be canceled x 2 wheather- illness. Recommend to call pulmonary again and reschedule PFTs Also 24-hour pulse oximetry was not done, will refer him again

## 2014-05-09 LAB — VITAMIN D 25 HYDROXY (VIT D DEFICIENCY, FRACTURES): Vit D, 25-Hydroxy: 53 ng/mL (ref 30–89)

## 2014-05-10 NOTE — Assessment & Plan Note (Signed)
  Vitamin D deficiency, on supplements, labs

## 2014-05-12 LAB — TESTOSTERONE, FREE, TOTAL, SHBG
Sex Hormone Binding: 50 nmol/L (ref 13–71)
TESTOSTERONE FREE: 22 pg/mL — AB (ref 47.0–244.0)
TESTOSTERONE-% FREE: 1.4 % — AB (ref 1.6–2.9)
TESTOSTERONE: 154 ng/dL — AB (ref 300–890)

## 2014-05-14 ENCOUNTER — Telehealth: Payer: Self-pay | Admitting: *Deleted

## 2014-05-14 DIAGNOSIS — J449 Chronic obstructive pulmonary disease, unspecified: Secondary | ICD-10-CM

## 2014-05-14 NOTE — Telephone Encounter (Signed)
Message copied by Eustace Quail on Thu May 14, 2014  9:54 AM ------      Message from: [name] E      Created: Sun May 10, 2014 11:39 AM       Needs overnight O2 sat, dx COPD please arrange  ------

## 2014-05-14 NOTE — Telephone Encounter (Signed)
Error

## 2014-05-14 NOTE — Telephone Encounter (Signed)
Received phone call from [facility] stating they needed more information for referral. Requested information faxed to [phone]. JG//CMA

## 2014-05-14 NOTE — Telephone Encounter (Signed)
o2 sat order sent to [facility] fax : [phone]

## 2014-05-15 ENCOUNTER — Telehealth: Payer: Self-pay | Admitting: Internal Medicine

## 2014-05-15 NOTE — Telephone Encounter (Signed)
Spoke with Dakota [name] with [facility] and have set up overnight pulse oximetry for this pt. Form completed and faxed to [facility] at # [phone].

## 2014-05-15 NOTE — Telephone Encounter (Signed)
Caller name:Melissa Relation to pt: Call back number:[phone] Fax number: [phone] Pharmacy:  Reason for call: Melissa from [facility] called and stated that they still are missing Nilson's office notes and oxygen testing. Please advise.

## 2014-05-18 ENCOUNTER — Telehealth: Payer: Self-pay | Admitting: Internal Medicine

## 2014-05-18 DIAGNOSIS — R7989 Other specified abnormal findings of blood chemistry: Secondary | ICD-10-CM

## 2014-05-18 MED ORDER — BROMOCRIPTINE MESYLATE 0.8 MG PO TABS: 0.8000 mg | ORAL_TABLET | Freq: Every day | ORAL | Status: DC

## 2014-05-18 NOTE — Telephone Encounter (Addendum)
05/18/14 10:25 Spoke with pt and made aware of lab results. New Rx sent to pharmacy, pt aware of new dosing.      Message copied by Baldwin Jamaica on Mon May 18, 2014 10:24 AM ------      Message from: Wanda Plump      Created: Fri May 15, 2014 12:22 PM       Advise patient:      Thyroid  is better continue with same Synthroid      Testosterone continue to be low, increase bromocriptine 0.8 mg to 1.5 tablet daily      Vitamin D improved, continue with daily supplements ------

## 2014-05-18 NOTE — Telephone Encounter (Signed)
Pt was returning call to office.    Phone # [phone]

## 2014-05-18 NOTE — Progress Notes (Signed)
Left message on voicemail to return call regarding labs.  

## 2014-05-26 ENCOUNTER — Other Ambulatory Visit: Payer: Self-pay | Admitting: Internal Medicine

## 2014-05-26 ENCOUNTER — Telehealth: Payer: Self-pay | Admitting: Internal Medicine

## 2014-05-26 DIAGNOSIS — J449 Chronic obstructive pulmonary disease, unspecified: Secondary | ICD-10-CM

## 2014-05-26 DIAGNOSIS — J329 Chronic sinusitis, unspecified: Secondary | ICD-10-CM

## 2014-05-26 DIAGNOSIS — J441 Chronic obstructive pulmonary disease with (acute) exacerbation: Secondary | ICD-10-CM

## 2014-05-26 MED ORDER — PREDNISONE 10 MG PO TABS: 20.0000 mg | ORAL_TABLET | Freq: Every day | ORAL | Status: DC

## 2014-05-26 MED ORDER — TIOTROPIUM BROMIDE MONOHYDRATE 18 MCG IN CAPS: ORAL_CAPSULE | RESPIRATORY_TRACT | Status: DC

## 2014-05-26 NOTE — Telephone Encounter (Signed)
He had 10 days worth of amoxicillin. Recommend prednisone 10 mg:  2 tablets daily for 5 days #10, no RF

## 2014-05-26 NOTE — Telephone Encounter (Addendum)
Spoke with patients wife and left a message for the patient to return my call.   Patient called back and was advised of Dr. Drue Novel recommendations.

## 2014-05-26 NOTE — Telephone Encounter (Signed)
Caller name: Rudolphe Relation to pt: self Call back number:[phone] Pharmacy: [id number] - [place], [place] - [address] AT [facility] [address] [facility] [address]  Reason for call:  Pt was seen on 5/22 for sinus infection.  Pt was given amoxicillin (AMOXIL) 500 MG ; pt states that he started feeling better, but he is still have a lot of congestion and yellowish spit up.  Pt wants to know if he can get another antibiotic called it.

## 2014-06-01 ENCOUNTER — Other Ambulatory Visit: Payer: Self-pay | Admitting: Internal Medicine

## 2014-06-10 ENCOUNTER — Other Ambulatory Visit: Payer: Self-pay | Admitting: Internal Medicine

## 2014-08-14 ENCOUNTER — Other Ambulatory Visit: Payer: Self-pay | Admitting: Internal Medicine

## 2014-09-21 ENCOUNTER — Other Ambulatory Visit: Payer: Self-pay | Admitting: Internal Medicine

## 2014-10-29 ENCOUNTER — Other Ambulatory Visit: Payer: Self-pay | Admitting: Internal Medicine

## 2014-12-03 ENCOUNTER — Other Ambulatory Visit: Payer: Self-pay | Admitting: Internal Medicine

## 2014-12-18 HISTORY — PX: FACET JOINT INJECTION: SHX5016

## 2015-01-07 ENCOUNTER — Other Ambulatory Visit: Payer: Self-pay | Admitting: Internal Medicine

## 2015-01-20 ENCOUNTER — Other Ambulatory Visit: Payer: Self-pay

## 2015-01-21 ENCOUNTER — Encounter: Payer: Self-pay | Admitting: Internal Medicine

## 2015-01-21 ENCOUNTER — Ambulatory Visit (INDEPENDENT_AMBULATORY_CARE_PROVIDER_SITE_OTHER): Payer: Medicare Other | Admitting: Internal Medicine

## 2015-01-21 ENCOUNTER — Ambulatory Visit (HOSPITAL_BASED_OUTPATIENT_CLINIC_OR_DEPARTMENT_OTHER)
Admission: RE | Admit: 2015-01-21 | Discharge: 2015-01-21 | Disposition: A | Discharge status: Home / Self Care | Payer: Medicare Other | Source: Ambulatory Visit | Attending: Internal Medicine | Admitting: Internal Medicine

## 2015-01-21 VITALS — BP 142/78 | HR 75 | Temp 98.4°F | Wt 135.0 lb

## 2015-01-21 DIAGNOSIS — J449 Chronic obstructive pulmonary disease, unspecified: Secondary | ICD-10-CM | POA: Insufficient documentation

## 2015-01-21 DIAGNOSIS — R0602 Shortness of breath: Secondary | ICD-10-CM | POA: Diagnosis present

## 2015-01-21 DIAGNOSIS — E291 Testicular hypofunction: Secondary | ICD-10-CM

## 2015-01-21 DIAGNOSIS — R7303 Prediabetes: Secondary | ICD-10-CM

## 2015-01-21 DIAGNOSIS — E038 Other specified hypothyroidism: Secondary | ICD-10-CM

## 2015-01-21 DIAGNOSIS — R7309 Other abnormal glucose: Secondary | ICD-10-CM

## 2015-01-21 DIAGNOSIS — F32A Depression, unspecified: Secondary | ICD-10-CM

## 2015-01-21 DIAGNOSIS — F329 Major depressive disorder, single episode, unspecified: Secondary | ICD-10-CM

## 2015-01-21 DIAGNOSIS — E034 Atrophy of thyroid (acquired): Secondary | ICD-10-CM

## 2015-01-21 DIAGNOSIS — R634 Abnormal weight loss: Secondary | ICD-10-CM

## 2015-01-21 DIAGNOSIS — R7989 Other specified abnormal findings of blood chemistry: Secondary | ICD-10-CM

## 2015-01-21 DIAGNOSIS — R0989 Other specified symptoms and signs involving the circulatory and respiratory systems: Secondary | ICD-10-CM

## 2015-01-21 LAB — CBC WITH DIFFERENTIAL/PLATELET
BASOS ABS: 0.1 10*3/uL (ref 0.0–0.1)
BASOS PCT: 0.8 % (ref 0.0–3.0)
EOS ABS: 0.2 10*3/uL (ref 0.0–0.7)
Eosinophils Relative: 2.6 % (ref 0.0–5.0)
HEMATOCRIT: 42.3 % (ref 39.0–52.0)
Hemoglobin: 14.4 g/dL (ref 13.0–17.0)
LYMPHS ABS: 2.4 10*3/uL (ref 0.7–4.0)
Lymphocytes Relative: 28.9 % (ref 12.0–46.0)
MCHC: 34.1 g/dL (ref 30.0–36.0)
MCV: 94.7 fl (ref 78.0–100.0)
Monocytes Absolute: 0.8 10*3/uL (ref 0.1–1.0)
Monocytes Relative: 9.2 % (ref 3.0–12.0)
Neutro Abs: 4.9 10*3/uL (ref 1.4–7.7)
Neutrophils Relative %: 58.5 % (ref 43.0–77.0)
Platelets: 251 10*3/uL (ref 150.0–400.0)
RBC: 4.46 Mil/uL (ref 4.22–5.81)
RDW: 13.9 % (ref 11.5–15.5)
WBC: 8.4 10*3/uL (ref 4.0–10.5)

## 2015-01-21 LAB — TSH: TSH: 6.28 u[IU]/mL — ABNORMAL HIGH (ref 0.35–4.50)

## 2015-01-21 LAB — COMPREHENSIVE METABOLIC PANEL
ALK PHOS: 72 U/L (ref 39–117)
ALT: 16 U/L (ref 0–53)
AST: 22 U/L (ref 0–37)
Albumin: 4.3 g/dL (ref 3.5–5.2)
BUN: 17 mg/dL (ref 6–23)
CO2: 29 meq/L (ref 19–32)
CREATININE: 1.12 mg/dL (ref 0.40–1.50)
Calcium: 9.5 mg/dL (ref 8.4–10.5)
Chloride: 105 mEq/L (ref 96–112)
GFR: 67.13 mL/min (ref >60.00)
GLUCOSE: 78 mg/dL (ref 70–99)
Potassium: 4.4 mEq/L (ref 3.5–5.1)
SODIUM: 141 meq/L (ref 135–145)
Total Bilirubin: 0.8 mg/dL (ref 0.2–1.2)
Total Protein: 6.8 g/dL (ref 6.0–8.3)

## 2015-01-21 LAB — SEDIMENTATION RATE: Sed Rate: 7 mm/hr (ref 0–22)

## 2015-01-21 LAB — HEMOGLOBIN A1C: HEMOGLOBIN A1C: 5.8 % (ref 4.6–6.5)

## 2015-01-21 MED ORDER — TIOTROPIUM BROMIDE MONOHYDRATE 18 MCG IN CAPS: ORAL_CAPSULE | RESPIRATORY_TRACT | Status: DC

## 2015-01-21 MED ORDER — BROMOCRIPTINE MESYLATE 0.8 MG PO TABS: ORAL_TABLET | ORAL | Status: DC

## 2015-01-21 MED ORDER — LEVOTHYROXINE SODIUM 50 MCG PO TABS: 50.0000 ug | ORAL_TABLET | Freq: Every day | ORAL | Status: DC

## 2015-01-21 MED ORDER — ALBUTEROL SULFATE HFA 108 (90 BASE) MCG/ACT IN AERS: 2.0000 | INHALATION_SPRAY | Freq: Four times a day (QID) | RESPIRATORY_TRACT | Status: DC | PRN

## 2015-01-21 NOTE — Assessment & Plan Note (Signed)
Check A1c. 

## 2015-01-21 NOTE — Patient Instructions (Addendum)
Get your blood work before you leave   Stop by the first floor and get the XR   Start taking omeprazole 20 mg 2 tablets every morning before breakfast  Please come back next week for an appointment, we will discuss the results then

## 2015-01-21 NOTE — Assessment & Plan Note (Signed)
On Effexor, last prescribed by Dr. Ethelene Halamos. PHQ  9 scored # 9, mild depression. Denies suicidal ideas Continue Effexor

## 2015-01-21 NOTE — Assessment & Plan Note (Addendum)
Patient reports 15 pounds weight loss in 3 months, per our scale is 6 pounds in 7 months. Review of system + for decreased appetite; no strong suspicion for  PMR, severe depression or intestinal  angina. He did complain of dysphagia for 2 months. Last vitamin D normal. Plan:  Check labs including A1c, testosterone etc. Check a chest x-ray advised to come back next weeks to discuss results. May need to see GI for dysphagia

## 2015-01-21 NOTE — Assessment & Plan Note (Signed)
Good compliance with Synthroid, check a TSH 

## 2015-01-21 NOTE — Assessment & Plan Note (Signed)
Palpable aorta, will do ultrasound

## 2015-01-21 NOTE — Assessment & Plan Note (Addendum)
On bromocriptine, check testosterone levels

## 2015-01-21 NOTE — Progress Notes (Signed)
Subjective:    Patient ID: Dakota [name], male    DOB: 02/17/1933, 81 y.o.   MRN: [id number]  DOS:  01/21/2015 Type of visit - description : acute, wt loss  Interval history: Patient needs appointment because weight loss, per his scales he lost 15 pounds in 3 months, per our scales he lost 6 pounds in 7 months. Insomnia, on Ambien, good results Hypogonadism, on bromocriptine Continue with back pain, follow-up by Dr. Ethelene Halamos, in addition to pain medication he also prescribed Effexor. The patient is taking Xanax as needed, not sure who is prescribing it.    Review of Systems Denies fever, chills. Admits to mild decrease in appetite. No chest pain but sometimes has DOE, no lower extremity edema No nausea, vomiting, diarrhea or blood in the stools. No postprandial abdominal pain. 2 months ago noted mild dysphagia to solids. Not to liquids Minimal cough. No hemoptysis. Occasionally has a headache but they are mild. Denies any rash. He does have back and lower extremity pains, felt to be due to back DJD but denies upper body aches or pains   Past Medical History  Diagnosis Date  . Chronic neck pain     and HA, dx w/ cervical stenosis  . GERD (gastroesophageal reflux disease)   . COPD (chronic obstructive pulmonary disease)   . Hyperlipidemia   . Peptic ulcer disease   . BPH (benign prostatic hypertrophy)   . Depression   . CAD (coronary artery disease)     per cath: mild/non obstructive 03/2006  . Osteopenia   . 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 Pulmonary nodule     s/p Ct x 2 last 05/2008: scarring  . Gallbladder & bile duct stone     per US 2007  . Diverticulosis of colon (without mention of hemorrhage)   . Hyperplastic colon polyp     Past Surgical History  Procedure Laterality Date  . Arthoscopic rotaor cuff repair      L-2003, R-2004 Dr Ranell PatrickNorris  . Inguinal hernia repair      r  . Ulcer surgery  1978  . Spinal cord stimulator insertion  5/11   lower back    History   Social History  . Marital Status: Married    Spouse Name: N/A    Number of Children: 2  . Years of Education: N/A   Occupational History  . 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 Smoking status: Former Games developermoker  . Smokeless tobacco: Never Used     Comment: 2011 aprox  . Alcohol Use: 0.0 oz/week    0 Not specified per week     Comment: denies daily intake, 1 or 2 drinkis x5/week   . Drug Use: Not on file  . Sexual Activity: No   Other Topics Concern  . Not on file   Social History Narrative   Lives w/ wife   daugther is medical PA              Medication List       This list is accurate as of: 01/21/15  3:26 PM.  Always use your most recent med list.               albuterol 108 (90 BASE) MCG/ACT inhaler  Commonly known as:  PROVENTIL HFA;VENTOLIN HFA  Inhale 2 puffs into the lungs every 6 (six) hours as needed. Shortness of  breath     ALPRAZolam 0.5 MG tablet  Commonly known as:  XANAX     atorvastatin 20 MG tablet  Commonly known as:  LIPITOR  TAKE 1 TABLET BY MOUTH AT BEDTIME     Bromocriptine Mesylate 0.8 MG Tabs  Commonly known as:  CYCLOSET  Take 1 tablet daily.     cyclobenzaprine 10 MG tablet  Commonly known as:  FLEXERIL  Take 1 tablet (10 mg total) by mouth 3 (three) times daily as needed. For muscle spasms     HYDROmorphone 8 MG tablet  Commonly known as:  DILAUDID     levothyroxine 50 MCG tablet  Commonly known as:  SYNTHROID, LEVOTHROID  Take 1 tablet (50 mcg total) by mouth daily.     meloxicam 15 MG tablet  Commonly known as:  MOBIC  Take 15 mg by mouth daily.     tiotropium 18 MCG inhalation capsule  Commonly known as:  SPIRIVA HANDIHALER  INHALE 1 CAPSULE VIA HANDIHALER ONCE DAILY AT THE SAME TIME EACH DAY     venlafaxine XR 75 MG 24 hr capsule  Commonly known as:  EFFEXOR-XR     Vitamin D (Ergocalciferol) 50000 UNITS Caps capsule  Commonly known as:  DRISDOL  Take 1 capsule  (50,000 Units total) by mouth every 7 (seven) days.     zolpidem 5 MG tablet  Commonly known as:  AMBIEN  Take 1 tablet (5 mg total) by mouth at bedtime as needed. For sleep           Objective:   Physical Exam  Constitutional: He is oriented to person, place, and time. He appears well-developed. No distress.  HENT:  Head: Normocephalic and atraumatic.  Eyes: No scleral icterus.  Neck: Normal range of motion. Neck supple. No thyromegaly present.  Normal carotid pulses  Cardiovascular:  RRR, no murmur, rub or gallop  Pulmonary/Chest: Effort normal. No stridor. No respiratory distress.  CTA B  Abdominal: Soft. Bowel sounds are normal. He exhibits no distension and no mass. There is no tenderness. There is no rebound and no guarding.  Aorta is palpable at the upper abdomen but is not tender, not bruit   Musculoskeletal: Normal range of motion. He exhibits no edema or tenderness.  Lymphadenopathy:    He has no cervical adenopathy.  Neurological: He is alert and oriented to person, place, and time. No cranial nerve deficit. He exhibits normal muscle tone. Coordination normal.  Speech normal, gait unassisted and normal for age, motor strength appropriate for age   Skin: Skin is warm and dry. No pallor.  No jaundice  Psychiatric: He has a normal mood and affect. His behavior is normal. Judgment and thought content normal.  Vitals reviewed.        Assessment & Plan:   Problem List Items Addressed This Visit      Respiratory   COPD (chronic obstructive pulmonary disease)   Relevant Medications   albuterol (PROVENTIL HFA;VENTOLIN HFA) 108 (90 BASE) MCG/ACT inhaler   tiotropium (SPIRIVA HANDIHALER) 18 MCG inhalation capsule   Other Relevant Orders   DG Chest 2 View     Endocrine   Hypogonadism in male - Primary    On bromocriptine, check testosterone levels      Relevant Orders   Testosterone, free, total   Hypothyroidism    Good compliance with Synthroid, check a TSH       Relevant Medications   levothyroxine (SYNTHROID, LEVOTHROID) tablet   Other Relevant Orders   TSH  Prediabetes    Check A1c      Relevant Orders   Hemoglobin A1c     Other   Depression    On Effexor, last prescribed by Dr. Ethelene Hal. PHQ  9 scored # 9, mild depression. Denies suicidal ideas Continue Effexor      Weight loss -- fatigue-- night sweats    Patient reports 15 pounds weight loss in 3 months, per our scale is 6 pounds in 7 months. Review of system + for decreased appetite, no strong indication of PMR, severe depression, in this time no angina. He did complain of dysphagia for 2 months. Last vitamin D normal. Plan:  Check labs including A1c, testosterone etc. Check a chest x-ray advised to come back next weeks to discuss results. May need to see GI for dysphagia      Relevant Orders   Comprehensive metabolic panel   CBC with Differential/Platelet   Sedimentation rate   Palpable abd. aorta    Palpable aorta, will do ultrasound

## 2015-01-22 LAB — TESTOSTERONE, FREE, TOTAL, SHBG
Sex Hormone Binding: 54 nmol/L (ref 22–77)
Testosterone, Free: 26 pg/mL — ABNORMAL LOW (ref 47.0–244.0)
Testosterone-% Free: 1.4 % — ABNORMAL LOW (ref 1.6–2.9)
Testosterone: 190 ng/dL — ABNORMAL LOW (ref 300–890)

## 2015-01-26 ENCOUNTER — Ambulatory Visit (HOSPITAL_BASED_OUTPATIENT_CLINIC_OR_DEPARTMENT_OTHER)
Admission: RE | Admit: 2015-01-26 | Discharge: 2015-01-26 | Disposition: A | Discharge status: Home / Self Care | Payer: Medicare Other | Source: Ambulatory Visit | Attending: Internal Medicine | Admitting: Internal Medicine

## 2015-01-26 DIAGNOSIS — R0989 Other specified symptoms and signs involving the circulatory and respiratory systems: Secondary | ICD-10-CM | POA: Insufficient documentation

## 2015-01-26 DIAGNOSIS — I2510 Atherosclerotic heart disease of native coronary artery without angina pectoris: Secondary | ICD-10-CM | POA: Insufficient documentation

## 2015-01-26 DIAGNOSIS — I671 Cerebral aneurysm, nonruptured: Secondary | ICD-10-CM | POA: Diagnosis not present

## 2015-01-26 MED ORDER — LEVOTHYROXINE SODIUM 75 MCG PO TABS: 75.0000 ug | ORAL_TABLET | Freq: Every day | ORAL | Status: DC

## 2015-01-26 NOTE — Addendum Note (Signed)
Addended by: Dorette GrateFAULKNER, Shadasia Oldfield C on: 01/26/2015 09:38 AM   Modules accepted: Orders, Medications

## 2015-01-29 ENCOUNTER — Ambulatory Visit (INDEPENDENT_AMBULATORY_CARE_PROVIDER_SITE_OTHER): Payer: Medicare Other | Admitting: Internal Medicine

## 2015-01-29 ENCOUNTER — Other Ambulatory Visit: Payer: Self-pay

## 2015-01-29 ENCOUNTER — Encounter: Payer: Self-pay | Admitting: Internal Medicine

## 2015-01-29 VITALS — BP 121/57 | HR 83 | Temp 98.3°F | Ht 63.0 in | Wt 137.0 lb

## 2015-01-29 DIAGNOSIS — E034 Atrophy of thyroid (acquired): Secondary | ICD-10-CM

## 2015-01-29 DIAGNOSIS — F329 Major depressive disorder, single episode, unspecified: Secondary | ICD-10-CM

## 2015-01-29 DIAGNOSIS — F32A Depression, unspecified: Secondary | ICD-10-CM

## 2015-01-29 DIAGNOSIS — R634 Abnormal weight loss: Secondary | ICD-10-CM

## 2015-01-29 DIAGNOSIS — K219 Gastro-esophageal reflux disease without esophagitis: Secondary | ICD-10-CM

## 2015-01-29 DIAGNOSIS — E291 Testicular hypofunction: Secondary | ICD-10-CM

## 2015-01-29 DIAGNOSIS — R0989 Other specified symptoms and signs involving the circulatory and respiratory systems: Secondary | ICD-10-CM

## 2015-01-29 DIAGNOSIS — E038 Other specified hypothyroidism: Secondary | ICD-10-CM

## 2015-01-29 NOTE — Patient Instructions (Addendum)
We will refer you to the gastroenterologist regards  difficulty swallowing  Use a moisturizer in your hands consistently, you may like to try Aveeno  Next visit to see me in 3  months

## 2015-01-29 NOTE — Progress Notes (Signed)
Subjective:    Patient ID: Dakota [name], male    DOB: 12/17/1933, 81 y.o.   MRN: [id number]  DOS:  01/29/2015 Type of visit - description : follow-up Interval history: Since the  last time he was here he is feeling okay. He continue with dysphagia, symptoms started 6 months ago, to solids only, getting slightly worse? All  previous labs and x-rays discussed. Also complains of pain in the fingers tips, happening most winters, denies any change from pale to red in the hands when exposed to cold.  Review of Systems Denies nausea, vomiting, diarrhea or blood in the stools. No actual odynophagia  Past Medical History  Diagnosis Date  . Chronic neck pain     and HA, dx w/ cervical stenosis  . GERD (gastroesophageal reflux disease)   . COPD (chronic obstructive pulmonary disease)   . Hyperlipidemia   . Peptic ulcer disease   . BPH (benign prostatic hypertrophy)   . Depression   . CAD (coronary artery disease)     per cath: mild/non obstructive 03/2006  . Osteopenia   . 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 Pulmonary nodule     s/p Ct x 2 last 05/2008: scarring  . Gallbladder & bile duct stone     per US 2007  . Diverticulosis of colon (without mention of hemorrhage)   . Hyperplastic colon polyp     Past Surgical History  Procedure Laterality Date  . Arthoscopic rotaor cuff repair      L-2003, R-2004 Dr Ranell PatrickNorris  . Inguinal hernia repair      r  . Ulcer surgery  1978  . Spinal cord stimulator insertion  5/11    lower back    History   Social History  . Marital Status: Married    Spouse Name: N/A  . Number of Children: 2  . Years of Education: N/A   Occupational History  . 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 Smoking status: Former Games developermoker  . Smokeless tobacco: Never Used     Comment: 2011 aprox  . Alcohol Use: 0.0 oz/week    0 Standard drinks or equivalent per week     Comment: denies daily intake, 1 or 2 drinkis  x5/week   . Drug Use: Not on file  . Sexual Activity: No   Other Topics Concern  . Not on file   Social History Narrative   Lives w/ wife   daugther is medical PA              Medication List       This list is accurate as of: 01/29/15 11:59 PM.  Always use your most recent med list.               albuterol 108 (90 BASE) MCG/ACT inhaler  Commonly known as:  PROVENTIL HFA;VENTOLIN HFA  Inhale 2 puffs into the lungs every 6 (six) hours as needed. Shortness of breath     ALPRAZolam 0.5 MG tablet  Commonly known as:  XANAX     atorvastatin 20 MG tablet  Commonly known as:  LIPITOR  TAKE 1 TABLET BY MOUTH AT BEDTIME     Bromocriptine Mesylate 0.8 MG Tabs  Commonly known as:  CYCLOSET  Take 1 tablet daily.     cyclobenzaprine 10 MG tablet  Commonly known as:  FLEXERIL  Take 1 tablet (10 mg total)  by mouth 3 (three) times daily as needed. For muscle spasms     HYDROmorphone 8 MG tablet  Commonly known as:  DILAUDID     levothyroxine 75 MCG tablet  Commonly known as:  SYNTHROID, LEVOTHROID  Take 1 tablet (75 mcg total) by mouth daily.     meloxicam 15 MG tablet  Commonly known as:  MOBIC  Take 15 mg by mouth daily.     tiotropium 18 MCG inhalation capsule  Commonly known as:  SPIRIVA HANDIHALER  INHALE 1 CAPSULE VIA HANDIHALER ONCE DAILY AT THE SAME TIME EACH DAY     venlafaxine XR 75 MG 24 hr capsule  Commonly known as:  EFFEXOR-XR     Vitamin D (Ergocalciferol) 50000 UNITS Caps capsule  Commonly known as:  DRISDOL  Take 1 capsule (50,000 Units total) by mouth every 7 (seven) days.     zolpidem 5 MG tablet  Commonly known as:  AMBIEN  Take 1 tablet (5 mg total) by mouth at bedtime as needed. For sleep           Objective:   Physical Exam  Constitutional: He appears well-developed and well-nourished. No distress.  HENT:  Head: Normocephalic and atraumatic.  Neck:  No thyromegaly, lymphadenopathy or cough use mass  Musculoskeletal:  Hands-  fingers warm, the tip of the fingers are very dry with skin openings, no redness swelling or cellulitis type of changes  Skin: Skin is warm. No rash noted. He is not diaphoretic. No pallor.  Psychiatric: He has a normal mood and affect. His behavior is normal. Judgment and thought content normal.  Seems to be in better spirits today         Assessment & Plan:   Skin dryness, finger pain: Recommended most risers, call if not improving, I think the pain is related to skin openings due to dryness  Problem List Items Addressed This Visit      Digestive   GERD    Continue complaining of dysphagia, had a normal EGD in 2008, not bx taken, he is a smoker and has a history of GERD. Refer to GI, EGD?      Relevant Orders   Ambulatory referral to Gastroenterology     Endocrine   Hypogonadism in male - Primary    To see Dr. Everardo All      Hypothyroidism    base on last TSH, dose of thyroid change it, has not started a new dose        Other   Depression    he seems to be in better spirits today without any intervention.      Weight loss -- fatigue-- night sweats     workup essentially negative, has gained 2 pounds,      Palpable abd. aorta    Ultrasound was negative for AAA

## 2015-01-29 NOTE — Progress Notes (Signed)
Pre visit review using our clinic review tool, if applicable. No additional management support is needed unless otherwise documented below in the visit note. 

## 2015-01-30 NOTE — Assessment & Plan Note (Signed)
base on last TSH, dose of thyroid change it, has not started a new dose

## 2015-01-30 NOTE — Assessment & Plan Note (Signed)
Continue complaining of dysphagia, had a normal EGD in 2008, not bx taken, he is a smoker and has a history of GERD. Refer to GI, EGD?

## 2015-01-30 NOTE — Assessment & Plan Note (Signed)
To see Dr. Everardo AllEllison

## 2015-01-30 NOTE — Assessment & Plan Note (Signed)
Ultrasound was negative for AAA

## 2015-01-30 NOTE — Assessment & Plan Note (Signed)
workup essentially negative, has gained 2 pounds,

## 2015-01-30 NOTE — Assessment & Plan Note (Signed)
he seems to be in better spirits today without any intervention.

## 2015-02-01 ENCOUNTER — Encounter: Payer: Self-pay | Admitting: Nurse Practitioner

## 2015-02-05 ENCOUNTER — Ambulatory Visit (INDEPENDENT_AMBULATORY_CARE_PROVIDER_SITE_OTHER)
Admission: RE | Admit: 2015-02-05 | Discharge: 2015-02-05 | Disposition: A | Discharge status: Home / Self Care | Payer: Medicare Other | Source: Ambulatory Visit | Attending: Nurse Practitioner | Admitting: Nurse Practitioner

## 2015-02-05 ENCOUNTER — Ambulatory Visit (INDEPENDENT_AMBULATORY_CARE_PROVIDER_SITE_OTHER): Payer: Medicare Other | Admitting: Nurse Practitioner

## 2015-02-05 ENCOUNTER — Encounter: Payer: Self-pay | Admitting: Nurse Practitioner

## 2015-02-05 VITALS — BP 140/64 | HR 74 | Ht 63.0 in | Wt 138.0 lb

## 2015-02-05 DIAGNOSIS — R0781 Pleurodynia: Secondary | ICD-10-CM

## 2015-02-05 DIAGNOSIS — R1314 Dysphagia, pharyngoesophageal phase: Secondary | ICD-10-CM

## 2015-02-05 DIAGNOSIS — K219 Gastro-esophageal reflux disease without esophagitis: Secondary | ICD-10-CM

## 2015-02-05 NOTE — Patient Instructions (Signed)
Please go to the basement level to our radiology department for an X-ray.   You have been scheduled for an endoscopy. Please follow written instructions given to you at your visit today. If you use inhalers (even only as needed), please bring them with you on the day of your procedure.

## 2015-02-07 DIAGNOSIS — R1314 Dysphagia, pharyngoesophageal phase: Secondary | ICD-10-CM | POA: Insufficient documentation

## 2015-02-07 NOTE — Progress Notes (Signed)
HPI :  Patient is a 81 year old male known remotely to [place] (Dr. Jarold Motto). Patient has a history of diverticulitis (2007), chronic GERD is s/p remote partial gastrectomy for PUD. He had an EGD in 2008 with findings of a submucosal nodule in distal esophagus (no bx) and a widely patent anastomosis. Solid food dysphagia began approximately two months ago and occurs with every meal. No significant weight loss over last 6 months based on office visits in Epic. Patient on a daily PPI. No significant heartburn   Patient fell and hit his left back on the bathtub a few days ago. He complains of pain in left back over lower ribs. It hurts to breath deep or cough.    Past Medical History  Diagnosis Date  . Chronic neck pain     and HA, dx w/ cervical stenosis  . GERD (gastroesophageal reflux disease)   . COPD (chronic obstructive pulmonary disease)   . Hyperlipidemia   . Peptic ulcer disease   . BPH (benign prostatic hypertrophy)   . Depression   . CAD (coronary artery disease)     per cath: mild/non obstructive 03/2006  . Osteopenia   . 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 Pulmonary nodule     s/p Ct x 2 last 05/2008: scarring  . Gallbladder & bile duct stone     per [place] 2007  . Diverticulosis of colon (without mention of hemorrhage)   . Hyperplastic colon polyp     Family History  Problem Relation Age of Onset  . Heart attack Neg Hx   . Diabetes Neg Hx   . Colon cancer Neg Hx   . Prostate cancer Father   . Prostate cancer Brother     spead to his bladder   History  Substance Use Topics  . Smoking status: Former Games developer  . Smokeless tobacco: Never Used     Comment: 2011 aprox  . Alcohol Use: 0.0 oz/week    0 Standard drinks or equivalent per week     Comment: denies daily intake, 1 or 2 drinkis x5/week    Current Outpatient Prescriptions  Medication Sig Dispense Refill  . albuterol (PROVENTIL HFA;VENTOLIN HFA) 108 (90 BASE) MCG/ACT inhaler Inhale 2 puffs  into the lungs every 6 (six) hours as needed. Shortness of breath 1 Inhaler 1  . ALPRAZolam (XANAX) 0.5 MG tablet Take 0.5 mg by mouth at bedtime as needed.     Marland Kitchen atorvastatin (LIPITOR) 20 MG tablet TAKE 1 TABLET BY MOUTH AT BEDTIME 30 tablet 6  . Bromocriptine Mesylate (CYCLOSET) 0.8 MG TABS Take 1 tablet daily. 30 tablet 6  . cyclobenzaprine (FLEXERIL) 10 MG tablet Take 1 tablet (10 mg total) by mouth 3 (three) times daily as needed. For muscle spasms 60 tablet 0  . Esomeprazole Magnesium (NEXIUM PO) Take by mouth. OTC , takes one every AM    . HYDROmorphone (DILAUDID) 8 MG tablet     . levothyroxine (SYNTHROID, LEVOTHROID) 75 MCG tablet Take 1 tablet (75 mcg total) by mouth daily. 30 tablet 6  . meloxicam (MOBIC) 15 MG tablet Take 15 mg by mouth daily.    Marland Kitchen tiotropium (SPIRIVA HANDIHALER) 18 MCG inhalation capsule INHALE 1 CAPSULE VIA HANDIHALER ONCE DAILY AT THE SAME TIME EACH DAY 30 capsule 6  . venlafaxine XR (EFFEXOR-XR) 75 MG 24 hr capsule     . Vitamin D, Ergocalciferol, (DRISDOL) 50000 UNITS CAPS capsule Take 1  capsule (50,000 Units total) by mouth every 7 (seven) days. 12 capsule 0  . zolpidem (AMBIEN) 5 MG tablet Take 1 tablet (5 mg total) by mouth at bedtime as needed. For sleep 30 tablet 3   No current facility-administered medications for this visit.   Allergies  Allergen Reactions  . Sulfonamide Derivatives     REACTION: unspecified     Review of Systems: Positive for anxiety, arthritis, back pain, depression, muscle pains, night sweats, shortness of breath and urine leakage. All other systems reviewed and negative except where noted in HPI.    Physical Exam: BP 140/64 mmHg  Pulse 74  Ht 5\' 3"  (1.6 m)  Wt 138 lb (62.596 kg)  BMI 24.45 kg/m2 Constitutional: Pleasant,well-developed, white male in no acute distress. HEENT: Normocephalic and atraumatic. Conjunctivae are normal. No scleral icterus. Neck supple.  Cardiovascular: Normal rate, regular rhythm.    Pulmonary/chest: Effort normal and breath sounds normal. No wheezing, rales or rhonchi. Abdominal: Soft, nondistended, nontender. Bowel sounds active throughout. There are no masses palpable. No hepatomegaly. Extremities: no edema Lymphadenopathy: No cervical adenopathy noted. Musculoskeletal: no bruises seen Neurological: Alert and oriented to person place and time. Skin: Skin is warm and dry. No rashes noted. Psychiatric: Normal mood and affect. Behavior is normal.   ASSESSMENT AND PLAN:  481. 81 year old male with two month history of solid food dysphagia. Patient reports excessive weight loss based on  home scale but only 3 [facility]. Last EGD in 2008 with findings of patent anastomosis and distal esophageal submucosal nodule (not biopsied). Patient is s/p very remote partial gastrectomy for PUD. His last EGD in 2008 revealed patent anastomosis and a submucosal nodule in distal esophagus (not biopsied). Dysphagia could be secondary to dysmotility. Stricture or neoplasm needs to be excluded. . For further evaluation patient will be scheduled for EGD with possible dilation. The benefits, risks, and potential complications of EGD with possible biopsies and/or dilation were discussed with the patient and he agrees to proceed.   2. Fall at home. Patient fell on left back against the bathtub. He has left posterior ribcage pain and tenderness. Since it is Friday and patient may not be able to get into his PCP I will order left rib xray and call him with results.      Cc: Willow OraJose, Paz, MD

## 2015-02-08 ENCOUNTER — Ambulatory Visit (INDEPENDENT_AMBULATORY_CARE_PROVIDER_SITE_OTHER): Payer: Medicare Other | Admitting: Endocrinology

## 2015-02-08 ENCOUNTER — Encounter: Payer: Self-pay | Admitting: Endocrinology

## 2015-02-08 VITALS — BP 128/62 | HR 73 | Temp 97.8°F | Ht 63.0 in | Wt 138.0 lb

## 2015-02-08 DIAGNOSIS — Z125 Encounter for screening for malignant neoplasm of prostate: Secondary | ICD-10-CM

## 2015-02-08 DIAGNOSIS — E039 Hypothyroidism, unspecified: Secondary | ICD-10-CM

## 2015-02-08 DIAGNOSIS — E291 Testicular hypofunction: Secondary | ICD-10-CM

## 2015-02-08 LAB — PSA: PSA: 1.35 ng/mL (ref 0.10–4.00)

## 2015-02-08 MED ORDER — TESTOSTERONE 12.5 MG/ACT (1%) TD GEL: 1.0000 | Freq: Every day | TRANSDERMAL | Status: DC

## 2015-02-08 NOTE — Progress Notes (Signed)
Subjective:    Patient ID: Dakota [name], male    DOB: 04/07/1933, 81 y.o.   MRN: [id number]  HPI Pt returns for f/u of idiopathic central hypogonadism (dx'ed 2013; he also has mildly elevated prolactin and hypothyroidism; CT in 2013 made no mention of the pituitary; he did not tolerate clomid, due to falls).  Pt says pt has unsteadiness on the feet.  He has occasional falls.  He also reports fatigue, and therefore wants to treat the low testosterone. Past Medical History  Diagnosis Date  . Chronic neck pain     and HA, dx w/ cervical stenosis  . GERD (gastroesophageal reflux disease)   . COPD (chronic obstructive pulmonary disease)   . Hyperlipidemia   . Peptic ulcer disease   . BPH (benign prostatic hypertrophy)   . Depression   . CAD (coronary artery disease)     per cath: mild/non obstructive 03/2006  . Osteopenia   . 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 Pulmonary nodule     s/p Ct x 2 last 05/2008: scarring  . Gallbladder & bile duct stone     per [place] 2007  . Diverticulosis of colon (without mention of hemorrhage)   . Hyperplastic colon polyp   . Hypertension   . Thyroid disease     Past Surgical History  Procedure Laterality Date  . Arthoscopic rotaor cuff repair      L-2003, R-2004 Dr Ranell Patrick  . Inguinal hernia repair      r  . Ulcer surgery  1978  . Spinal cord stimulator insertion  5/11    lower back    History   Social History  . Marital Status: Married    Spouse Name: N/A  . Number of Children: 2  . Years of Education: N/A   Occupational History  . 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 Smoking status: Former Games developer  . Smokeless tobacco: Never Used     Comment: 2011 aprox  . Alcohol Use: 1.2 oz/week    0 Standard drinks or equivalent, 2 Shots of liquor per week     Comment: 2 mixed drinks a week  . Drug Use: No  . Sexual Activity: No   Other Topics Concern  .  Not on file   Social History Narrative   Lives w/ wife   daugther is medical PA          Current Outpatient Prescriptions on File Prior to Visit  Medication Sig Dispense Refill  . albuterol (PROVENTIL HFA;VENTOLIN HFA) 108 (90 BASE) MCG/ACT inhaler Inhale 2 puffs into the lungs every 6 (six) hours as needed. Shortness of breath 1 Inhaler 1  . ALPRAZolam (XANAX) 0.5 MG tablet Take 0.5 mg by mouth at bedtime as needed.     Marland Kitchen atorvastatin (LIPITOR) 20 MG tablet TAKE 1 TABLET BY MOUTH AT BEDTIME 30 tablet 6  . cyclobenzaprine (FLEXERIL) 10 MG tablet Take 1 tablet (10 mg total) by mouth 3 (three) times daily as needed. For muscle spasms 60 tablet 0  . Esomeprazole Magnesium (NEXIUM PO) Take by mouth. OTC , takes one every AM    . HYDROmorphone (DILAUDID) 8 MG tablet     . levothyroxine (SYNTHROID, LEVOTHROID) 75 MCG tablet Take 1 tablet (75 mcg total) by mouth daily. 30 tablet 6  . meloxicam (MOBIC) 15  MG tablet Take 15 mg by mouth daily.    Marland Kitchen. tiotropium (SPIRIVA HANDIHALER) 18 MCG inhalation capsule INHALE 1 CAPSULE VIA HANDIHALER ONCE DAILY AT THE SAME TIME EACH DAY 30 capsule 6  . venlafaxine XR (EFFEXOR-XR) 75 MG 24 hr capsule     . Vitamin D, Ergocalciferol, (DRISDOL) 50000 UNITS CAPS capsule Take 1 capsule (50,000 Units total) by mouth every 7 (seven) days. 12 capsule 0  . zolpidem (AMBIEN) 5 MG tablet Take 1 tablet (5 mg total) by mouth at bedtime as needed. For sleep 30 tablet 3   No current facility-administered medications on file prior to visit.    Allergies  Allergen Reactions  . Sulfonamide Derivatives     REACTION: unspecified    Family History  Problem Relation Age of Onset  . Heart attack Neg Hx   . Diabetes Neg Hx   . Colon cancer Neg Hx   . Stomach cancer Neg Hx   . Prostate cancer Father   . Prostate cancer Brother     spead to his bladder    BP 128/62 mmHg  Pulse 73  Temp(Src) 97.8 F (36.6 C) (Oral)  Ht 5\' 3"  (1.6 m)  Wt 138 lb (62.596 kg)  BMI 24.45  kg/m2  SpO2 90%  Review of Systems ED sxs are unchanged.  No change in mild chronic headache.    Objective:   Physical Exam VITAL SIGNS:  See vs page GENERAL: no distress NECK: There is no palpable thyroid enlargement.  No thyroid nodule is palpable.  No palpable lymphadenopathy at the anterior neck. Chest wall: mil;d bilat pseudogynecomastia Skin: normal hair distribution GENITALIA: Normal male testicles, scrotum, and penis.  Gait: slow but steady.   Lab Results  Component Value Date   TESTOSTERONE 190* 01/21/2015   Lab Results  Component Value Date   PSA 1.35 02/08/2015   PSA 0.62 07/23/2012   PSA 1.71 07/01/2010       Assessment & Plan:  Hypogonadism: moderate, central, idiopathic Fatigue: pt wants trial of testosterone rx, to see if it helps this. ED: uncertain relationship to hypogonadism. Hypothyroidism, on rx Hyperprolactinemia, mild, no rx is needed.    Patient is advised the following: Patient Instructions  In my opinion, you can stay off both the clomiphene and bromocriptine. Please continue the same levothyroxine.   Before taking the testosterone, we first need to check your PSA blood test.   When we have confirmed the result normal, i'll prescribe for you a testosterone gel, applied to the skin. normalization of testosterone is not known to harm you.  however, there are "theoretical" risks, including increased fertility, hair loss, prostate cancer, benign prostate enlargement, blood clots, liver problems, lower hdl ("good cholesterol"), polycythemia (opposite of anemia), sleep apnea, and behavior changes. Please come back for a follow-up appointment in 6 weeks.    addendum: i have sent a prescription to your pharmacy, for androgel.

## 2015-02-08 NOTE — Progress Notes (Signed)
i agree with the plan, note above 

## 2015-02-08 NOTE — Patient Instructions (Addendum)
In my opinion, you can stay off both the clomiphene and bromocriptine. Please continue the same levothyroxine.   Before taking the testosterone, we first need to check your PSA blood test.   When we have confirmed the result normal, i'll prescribe for you a testosterone gel, applied to the skin. normalization of testosterone is not known to harm you.  however, there are "theoretical" risks, including increased fertility, hair loss, prostate cancer, benign prostate enlargement, blood clots, liver problems, lower hdl ("good cholesterol"), polycythemia (opposite of anemia), sleep apnea, and behavior changes. Please come back for a follow-up appointment in 6 weeks.

## 2015-02-09 ENCOUNTER — Encounter: Payer: Self-pay | Admitting: Gastroenterology

## 2015-02-09 ENCOUNTER — Ambulatory Visit (AMBULATORY_SURGERY_CENTER): Payer: Medicare Other | Admitting: Gastroenterology

## 2015-02-09 VITALS — BP 144/81 | HR 77 | Temp 97.1°F | Resp 13 | Ht 63.0 in | Wt 138.0 lb

## 2015-02-09 DIAGNOSIS — R1314 Dysphagia, pharyngoesophageal phase: Secondary | ICD-10-CM

## 2015-02-09 DIAGNOSIS — K2990 Gastroduodenitis, unspecified, without bleeding: Secondary | ICD-10-CM

## 2015-02-09 DIAGNOSIS — K222 Esophageal obstruction: Secondary | ICD-10-CM

## 2015-02-09 DIAGNOSIS — K319 Disease of stomach and duodenum, unspecified: Secondary | ICD-10-CM

## 2015-02-09 DIAGNOSIS — K2970 Gastritis, unspecified, without bleeding: Secondary | ICD-10-CM

## 2015-02-09 MED ORDER — SODIUM CHLORIDE 0.9 % IV SOLN: 500.0000 mL | INTRAVENOUS | Status: DC

## 2015-02-09 NOTE — Patient Instructions (Signed)
YOU HAD AN ENDOSCOPIC PROCEDURE TODAY AT THE [facility]: Refer to the procedure report that was given to you for any specific questions about what was found during the examination.  If the procedure report does not answer your questions, please call your gastroenterologist to clarify.  If you requested that your care partner not be given the details of your procedure findings, then the procedure report has been included in a sealed envelope for you to review at your convenience later.  YOU SHOULD EXPECT: Some feelings of bloating in the abdomen. Passage of more gas than usual.  Walking can help get rid of the air that was put into your GI tract during the procedure and reduce the bloating. If you had a lower endoscopy (such as a colonoscopy or flexible sigmoidoscopy) you may notice spotting of blood in your stool or on the toilet paper. If you underwent a bowel prep for your procedure, then you may not have a normal bowel movement for a few days.  DIET: FOLLOW DILATION HANDOUT.   ACTIVITY: Your care partner should take you home directly after the procedure.  You should plan to take it easy, moving slowly for the rest of the day.  You can resume normal activity the day after the procedure however you should NOT DRIVE or use heavy machinery for 24 hours (because of the sedation medicines used during the test).    SYMPTOMS TO REPORT IMMEDIATELY: A gastroenterologist can be reached at any hour.  During normal business hours, 8:30 AM to 5:00 PM Monday through Friday, call [phone].  After hours and on weekends, please call the GI answering service at [phone] who will take a message and have the physician on call contact you.   Following upper endoscopy (EGD)  Vomiting of blood or coffee ground material  New chest pain or pain under the shoulder blades  Painful or persistently difficult swallowing  New shortness of breath  Fever of 100F or higher  Black, tarry-looking  stools  FOLLOW UP: If any biopsies were taken you will be contacted by phone or by letter within the next 1-3 weeks.  Call your gastroenterologist if you have not heard about the biopsies in 3 weeks.  Our staff will call the home number listed on your records the next business day following your procedure to check on you and address any questions or concerns that you may have at that time regarding the information given to you following your procedure. This is a courtesy call and so if there is no answer at the home number and we have not heard from you through the emergency physician on call, we will assume that you have returned to your regular daily activities without incident.  SIGNATURES/CONFIDENTIALITY: You and/or your care partner have signed paperwork which will be entered into your electronic medical record.  These signatures attest to the fact that that the information above on your After Visit Summary has been reviewed and is understood.  Full responsibility of the confidentiality of this discharge information lies with you and/or your care-partner.   Resume medications. Information Given on Dilation Diet with discharge instructions.

## 2015-02-09 NOTE — Progress Notes (Signed)
Called to room to assist during endoscopic procedure.  Patient ID and intended procedure confirmed with present staff. Received instructions for my participation in the procedure from the performing physician.  

## 2015-02-09 NOTE — Progress Notes (Signed)
Report to PACU, RN, vss, BBS= Clear.  

## 2015-02-09 NOTE — Op Note (Signed)
[facility] [address] [place], [id number]   ENDOSCOPY PROCEDURE REPORT  PATIENT: Dakota HymenWells, Ira  MR#: [id number] BIRTHDATE: [birth year] , 81  yrs. old GENDER: male ENDOSCOPIST: Rachael Feeaniel P Tamu Golz, MD PROCEDURE DATE:  02/09/2015 PROCEDURE:  EGD w/ biopsy and EGD w/ balloon dilation ASA CLASS:     Class III INDICATIONS:  chronic GERD is s/p remote partial gastrectomy for PUD.  He had an EGD in 2008 with findings of a submucosal nodule in distal esophagus (no bx) and a widely patent anastomosis.  Solid food dysphagia began approximately two months ago and occurs with every meal, + weight loss. MEDICATIONS: Monitored anesthesia care and Propofol 140 mg IV TOPICAL ANESTHETIC: none  DESCRIPTION OF PROCEDURE: After the risks benefits and alternatives of the procedure were thoroughly explained, informed consent was obtained.  The LB [id number] endoscope was introduced through the mouth and advanced to the second portion of the duodenum , Without limitations.  The instrument was slowly withdrawn as the mucosa was fully examined.  Gastroenteric (Bilroth II?) anastomosis was slightly edematous put widely patent.  Biopsies taken from gastric side to check for neoplasia, H.  pylori.  There was a smooth, benign stenosis at GE junction above a 1-2cm hiatal hernia.  Lumen through the stenosis was 15mm and it was dilated sequentially to 20mm using a CRE TTS balloon.  The previously noted submucosal bulge in distal esophagus (4-5cm proximal to the GE junction) was noted and it appears unchanged from the description on EGD Dr.  Jarold MottoPatterson 2008.  The examination was otherwise normal.  Retroflexed views revealed no abnormalities.     The scope was then withdrawn from the patient and the procedure completed.  COMPLICATIONS: There were no immediate complications.  ENDOSCOPIC IMPRESSION: Gastroenteric (Bilroth II?) anastomosis was slightly edematous put widely patent.   Biopsies taken from gastric side to check for neoplasia, H.  pylori.  There was a smooth, benign stenosis at GE junction above a 1-2cm hiatal hernia.  Lumen through the stenosis was 15mm and it was dilated sequentially to 20mm using a CRE TTS balloon.  The previously noted submucosal bulge in distal esophagus (4-5cm proximal to the GE junction) was noted and it appears unchanged from the description on EGD Dr.  Jarold MottoPatterson 2008.  The examination was otherwise normal  RECOMMENDATIONS: Await final pathology.  Continue nexium once daily.  Chew your food well, eat slowly and take small bites.  My office will contact you about follow up appt in 6-8 weeks.  eSigned:  Rachael Feeaniel P Zayda Angell, MD 02/09/2015 4:01 PM    CC: Willow OraJose Paz, MD

## 2015-02-10 ENCOUNTER — Telehealth: Payer: Self-pay | Admitting: *Deleted

## 2015-02-10 NOTE — Telephone Encounter (Signed)
  Follow up Call-  Call back number 02/09/2015  Post procedure Call Back phone  # [phone]  Permission to leave phone message Yes     Patient questions:  Do you have a fever, pain , or abdominal swelling? No. Pain Score  0 *  Have you tolerated food without any problems? Yes.    Have you been able to return to your normal activities? Yes.    Do you have any questions about your discharge instructions: Diet   No. Medications  No. Follow up visit  No.  Do you have questions or concerns about your Care? No.  Actions: * If pain score is 4 or above: No action needed, pain <4.

## 2015-02-16 ENCOUNTER — Telehealth: Payer: Self-pay

## 2015-02-16 NOTE — Telephone Encounter (Signed)
Ok, i'll do PA 

## 2015-02-16 NOTE — Telephone Encounter (Signed)
Contacted pt's insurance company. Due to the pt's policy all testosterone agents require a PA. Please advise on how to proceed. Thanks!

## 2015-02-17 ENCOUNTER — Encounter: Payer: Self-pay | Admitting: Gastroenterology

## 2015-02-17 NOTE — Telephone Encounter (Signed)
Form placed on Md's desk for completion.

## 2015-02-18 DIAGNOSIS — Z0279 Encounter for issue of other medical certificate: Secondary | ICD-10-CM

## 2015-02-19 ENCOUNTER — Telehealth: Payer: Self-pay | Admitting: Endocrinology

## 2015-02-19 NOTE — Telephone Encounter (Signed)
Raynelle FanningJulie with [facility] has androgel clarification questions please call her at [phone]

## 2015-02-19 NOTE — Telephone Encounter (Signed)
Contacted [employer]. They requested recent testosterone labs be faxed for review of Adrogel PA. Labs faxed per request.

## 2015-03-22 ENCOUNTER — Ambulatory Visit: Payer: Medicare Other | Admitting: Endocrinology

## 2015-05-20 ENCOUNTER — Telehealth: Payer: Self-pay | Admitting: Internal Medicine

## 2015-05-20 NOTE — Telephone Encounter (Signed)
Caller name: Del[place] Relation to pt: self Call back number: [phone] [facility]acy: [facility] [address]  Reason for call:   Requesting ambien refill

## 2015-05-21 MED ORDER — ZOLPIDEM TARTRATE 5 MG PO TABS: 5.0000 mg | ORAL_TABLET | Freq: Every evening | ORAL | Status: DC | PRN

## 2015-05-21 NOTE — Telephone Encounter (Signed)
30 and 3 RF

## 2015-05-21 NOTE — Telephone Encounter (Signed)
Pt is requesting refill on Ambien.  Last OV: 01/29/2015 Last Fill: 09/05/2012 #30 3RF  Please advise.

## 2015-05-21 NOTE — Telephone Encounter (Signed)
Rx faxed to [facility] pharmacy.  

## 2015-05-21 NOTE — Telephone Encounter (Signed)
Rx printed, awaiting MD signature.  

## 2015-07-01 ENCOUNTER — Other Ambulatory Visit: Payer: Self-pay | Admitting: Internal Medicine

## 2015-07-02 ENCOUNTER — Telehealth: Payer: Self-pay | Admitting: *Deleted

## 2015-07-02 NOTE — Telephone Encounter (Signed)
Prior authorization for proventil inhaler initiated. Awaiting determination. JG//CMA

## 2015-07-02 NOTE — Telephone Encounter (Signed)
PA denied. Covered alternative is Pro-Air HFA. Please advise. JG//CMA

## 2015-07-02 NOTE — Telephone Encounter (Signed)
Okay to use pro-air if patient agreeable. Same sig

## 2015-07-05 MED ORDER — ALBUTEROL SULFATE HFA 108 (90 BASE) MCG/ACT IN AERS: 2.0000 | INHALATION_SPRAY | Freq: Four times a day (QID) | RESPIRATORY_TRACT | Status: DC | PRN

## 2015-07-05 NOTE — Addendum Note (Signed)
Addended by: Amado CoeGLOVER, Lowry Bala A on: 07/05/2015 07:06 AM   Modules accepted: Orders, Medications

## 2015-07-05 NOTE — Telephone Encounter (Signed)
Sent to pharmacy. JG//CMA

## 2015-07-25 ENCOUNTER — Other Ambulatory Visit: Payer: Self-pay | Admitting: Internal Medicine

## 2015-08-09 ENCOUNTER — Other Ambulatory Visit: Payer: Self-pay | Admitting: Internal Medicine

## 2015-08-26 ENCOUNTER — Telehealth: Payer: Self-pay | Admitting: *Deleted

## 2015-08-26 NOTE — Telephone Encounter (Signed)
Approved.  

## 2015-08-26 NOTE — Telephone Encounter (Signed)
PA for zolpidem  tablets initiated through [facility]. Awaiting determination. JG//CMA

## 2015-09-27 ENCOUNTER — Other Ambulatory Visit: Payer: Self-pay | Admitting: Internal Medicine

## 2015-09-27 NOTE — Telephone Encounter (Signed)
Rx faxed to [facility] pharmacy.  

## 2015-09-27 NOTE — Telephone Encounter (Signed)
Advise patient to schedule a office visit at his convenience. Okay #30, no refills

## 2015-09-27 NOTE — Telephone Encounter (Signed)
Rx printed, awaiting MD signature.  

## 2015-09-27 NOTE — Telephone Encounter (Signed)
Pt is requesting refill on Ambien.  Last OV: 01/29/2015, no future appt scheduled Last Fill: 05/21/2015 #30 and 3RF UDS: Not needed  Please advise.

## 2015-10-29 ENCOUNTER — Other Ambulatory Visit: Payer: Self-pay | Admitting: Internal Medicine

## 2015-10-29 ENCOUNTER — Telehealth: Payer: Self-pay

## 2015-10-29 MED ORDER — ZOLPIDEM TARTRATE 5 MG PO TABS: 5.0000 mg | ORAL_TABLET | Freq: Every evening | ORAL | Status: DC | PRN

## 2015-10-29 NOTE — Telephone Encounter (Signed)
Rx printed, awaiting MD signature.  

## 2015-10-29 NOTE — Telephone Encounter (Signed)
[facility]. LMOM informing Pt to return call to schedule a routine follow-up.

## 2015-10-29 NOTE — Telephone Encounter (Signed)
Okay #30 refills. He is overdue for a visit. No further refills without office visit, please schedule one

## 2015-10-29 NOTE — Telephone Encounter (Signed)
Pt is requesting refill on Ambien.  Last OV: 01/29/2015, no future appt scheduled Last Fill: 09/27/2015 #30 and 0RF UDS: Not needed  Please advise.

## 2015-11-26 ENCOUNTER — Telehealth: Payer: Self-pay | Admitting: Internal Medicine

## 2015-11-26 NOTE — Telephone Encounter (Signed)
VM 11/26/15 12:05pm needing to schedule appt - called [phone] and left msg for pt to call back to schedule

## 2015-11-30 ENCOUNTER — Telehealth: Payer: Self-pay | Admitting: Internal Medicine

## 2015-11-30 MED ORDER — ZOLPIDEM TARTRATE 5 MG PO TABS: 5.0000 mg | ORAL_TABLET | Freq: Every evening | ORAL | Status: DC | PRN

## 2015-11-30 NOTE — Telephone Encounter (Signed)
Caller name: Self   Can be reached: [phone]  Pharmacy:  [id number] - [place], [place] - [address] [phone] (Phone) [phone] (Fax)         Reason for call: Request refill on zolpidem (AMBIEN) 5 MG tablet [[id number]][[id number]]

## 2015-11-30 NOTE — Telephone Encounter (Signed)
Pt is requesting refill on Ambien.  Last OV: 01/29/2015, F/U scheduled for 12/02/2015 Last Fill: 10/29/2015 #30 and 0RF UDS: Not needed  Please advise.

## 2015-11-30 NOTE — Telephone Encounter (Signed)
Rx faxed to [facility] pharmacy.  

## 2015-11-30 NOTE — Telephone Encounter (Signed)
Okay #30 and one refill 

## 2015-11-30 NOTE — Telephone Encounter (Signed)
Rx printed, awaiting MD signature.  

## 2015-12-02 ENCOUNTER — Ambulatory Visit: Payer: Medicare Other | Admitting: Internal Medicine

## 2015-12-07 ENCOUNTER — Ambulatory Visit (INDEPENDENT_AMBULATORY_CARE_PROVIDER_SITE_OTHER): Payer: Medicare Other | Admitting: Internal Medicine

## 2015-12-07 ENCOUNTER — Encounter: Payer: Self-pay | Admitting: Internal Medicine

## 2015-12-07 VITALS — BP 126/76 | HR 72 | Temp 97.9°F | Ht 63.0 in | Wt 143.5 lb

## 2015-12-07 DIAGNOSIS — R1013 Epigastric pain: Secondary | ICD-10-CM

## 2015-12-07 DIAGNOSIS — I10 Essential (primary) hypertension: Secondary | ICD-10-CM

## 2015-12-07 DIAGNOSIS — R634 Abnormal weight loss: Secondary | ICD-10-CM | POA: Diagnosis not present

## 2015-12-07 DIAGNOSIS — J449 Chronic obstructive pulmonary disease, unspecified: Secondary | ICD-10-CM

## 2015-12-07 DIAGNOSIS — Z09 Encounter for follow-up examination after completed treatment for conditions other than malignant neoplasm: Secondary | ICD-10-CM

## 2015-12-07 DIAGNOSIS — E039 Hypothyroidism, unspecified: Secondary | ICD-10-CM | POA: Diagnosis not present

## 2015-12-07 DIAGNOSIS — G8929 Other chronic pain: Secondary | ICD-10-CM | POA: Diagnosis not present

## 2015-12-07 MED ORDER — ACLIDINIUM BROMIDE 400 MCG/ACT IN AEPB: 1.0000 | INHALATION_SPRAY | Freq: Two times a day (BID) | RESPIRATORY_TRACT | Status: DC

## 2015-12-07 MED ORDER — ATORVASTATIN CALCIUM 20 MG PO TABS: 20.0000 mg | ORAL_TABLET | Freq: Every day | ORAL | Status: DC

## 2015-12-07 MED ORDER — LEVOTHYROXINE SODIUM 75 MCG PO TABS: 75.0000 ug | ORAL_TABLET | Freq: Every day | ORAL | Status: DC

## 2015-12-07 MED ORDER — ALBUTEROL SULFATE HFA 108 (90 BASE) MCG/ACT IN AERS: 2.0000 | INHALATION_SPRAY | Freq: Four times a day (QID) | RESPIRATORY_TRACT | Status: DC | PRN

## 2015-12-07 NOTE — Progress Notes (Signed)
Subjective:    Patient ID: Dakota [name], male    DOB: November 07, 1933, 81 y.o.   MRN: [id number]  DOS:  12/07/2015 Type of visit - description : Routine checkup, not seen in more than 8 months. Interval history: Hypogonadism: Not taking HRT COPD: Ran out of inhalers, spiriva was very expensive, currently doing okay. Hyperlipidemia: Ran out of Lipitor, needs a refill Depression, insomnia: Currently doing well. He is not very clear as far as how often he uses [place] or Xanax. Hypothyroidism: Reports good compliance with medicines.   Review of Systems Occasional nasal discharge lately. Leg cramps at night, B, not clearly if better by moving his legs, improves w/ massages . Denies chest pain or lower extremity edema No nausea, vomiting, blood in the stools or actual abdominal pain.  Past Medical History  Diagnosis Date  . Chronic neck pain     and HA, dx w/ cervical stenosis  . GERD (gastroesophageal reflux disease)   . COPD (chronic obstructive pulmonary disease) (HCC)   . Hyperlipidemia   . Peptic ulcer disease   . BPH (benign prostatic hypertrophy)   . Depression   . CAD (coronary artery disease)     per cath: mild/non obstructive 03/2006  . Osteopenia   . 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 Pulmonary nodule     s/p Ct x 2 last 05/2008: scarring  . Gallbladder & bile duct stone     per [place] 2007  . Diverticulosis of colon (without mention of hemorrhage)   . Hyperplastic colon polyp   . Hypertension   . Thyroid disease   . Lumbar spondylosis     Facet mediated pain, Dr. Ethelene Hal    Past Surgical History  Procedure Laterality Date  . Arthoscopic rotaor cuff repair      L-2003, R-2004 Dr Ranell Patrick  . Inguinal hernia repair      r  . Ulcer surgery  1978  . Spinal cord stimulator insertion  5/11    lower back  . Facet joint injection  2016    Bilateral L4-5 and L5-S1, Dr. Ethelene Hal    Social History   Social History  . Marital Status: Married   Spouse Name: N/A  . Number of Children: 2  . Years of Education: N/A   Occupational History  . 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 Smoking status: Former Games developer  . Smokeless tobacco: Never Used     Comment: 2011 aprox  . Alcohol Use: 1.2 oz/week    0 Standard drinks or equivalent, 2 Shots of liquor per week     Comment: 2 mixed drinks a week  . Drug Use: No  . Sexual Activity: No   Other Topics Concern  . Not on file   Social History Narrative   Lives w/ wife   daugther is medical PA              Medication List       This list is accurate as of: 12/07/15 11:59 PM.  Always use your most recent med list.               Aclidinium Bromide 400 MCG/ACT Aepb  Inhale 1 puff into the lungs 2 (two) times daily.     albuterol 108 (90 BASE) MCG/ACT inhaler  Commonly known as:  PROAIR HFA  Inhale 2 puffs into the lungs every 6 (six) hours as needed for wheezing or shortness of breath.     ALPRAZolam 0.5 MG tablet  Commonly known as:  XANAX  Take 0.5 mg by mouth at bedtime as needed. Reported on 12/07/2015     atorvastatin 20 MG tablet  Commonly known as:  LIPITOR  Take 1 tablet (20 mg total) by mouth at bedtime.     CYCLOSET 0.8 MG Tabs  Generic drug:  Bromocriptine Mesylate  Take 0.8 mg by mouth daily.     HYDROmorphone 8 MG tablet  Commonly known as:  DILAUDID     levothyroxine 75 MCG tablet  Commonly known as:  SYNTHROID, LEVOTHROID  Take 1 tablet (75 mcg total) by mouth daily before breakfast.     NEXIUM PO  Take by mouth. OTC , takes one every AM     venlafaxine XR 75 MG 24 hr capsule  Commonly known as:  EFFEXOR-XR     zolpidem 5 MG tablet  Commonly known as:  AMBIEN  Take 1 tablet (5 mg total) by mouth at bedtime as needed for sleep.           Objective:   Physical Exam BP 126/76 mmHg  Pulse 72  Temp(Src) 97.9 F (36.6 C) (Oral)  Ht 5\' 3"  (1.6 m)  Wt 143 lb 8 oz (65.091  kg)  BMI 25.43 kg/m2  SpO2 95% General:   Well developed, well nourished . NAD.  HEENT:  Normocephalic . Face symmetric, atraumatic Lungs:  CTA B Normal respiratory effort, no intercostal retractions, no accessory muscle use. Heart: RRR,  no murmur.  no pretibial edema bilaterally  Abdomen:  Not distended, soft, slightly TTP at the upper abdomen without mass or rebound.  Skin: Not pale. Not jaundice Neurologic:  alert & oriented X3.  Speech normal, gait appropriate for age and unassisted Psych--  Cognition and judgment appear intact.  Cooperative with normal attention span and concentration.  Behavior appropriate. No anxious or depressed appearing.    Assessment & Plan:   Assessment: Hyperlipidemia Hypothyroidism Hypogonadism-- not on HRT Depression, insomnia (effexor, xanas, ambien) COPD GI:  GERD, PUD, diverticulosis, colon polyps Osteopenia CV: Aneurysm, left ICA 2 mm 2009, saw neurology Mild non-obstructive CAD per cath 2007 MSK: ---Pain med rx by Dr Ethelene Halamos --Lumbar spondylosis --Spinal cord stimulation 2011 --Chronic neck pain and headache, DX cervical stenosis H/o Weight loss, fatigue, sweats Palpable aorta, ultrasound (-) H/o vit d def ?PMR 2013   PLAN: HTN: Seems to be well-controlled, check a BMP Hyperlipidemia: Refill Lipitor and check FLP on return to the office Hypothyroidism: Check a TSH, reports good compliance with meds Hypogonadism: Not taking HRT, if he decides to be treated, recommend to discuss with Dr. Everardo AllEllison Depression insomnia: Symptoms currently well controlled. History of PUD: Slightly TTP at the epigastric area, review of systems negative. Recommend to dc meloxicam. Check a CBC COPD: Refill albuterol,spiriva $$$, try Tudorza Low vitamin D: Recommend OTC vitamin D daily Weight loss, fatigue, sweats: It was an issue  for several months about a year ago, seems to be better. Leg cramps:  Recommend trial with tonic water Primary care:  Declined a flu shot, benefits discussed RTC 3 months

## 2015-12-07 NOTE — Progress Notes (Signed)
Pre visit review using our clinic review tool, if applicable. No additional management support is needed unless otherwise documented below in the visit note. 

## 2015-12-07 NOTE — Patient Instructions (Signed)
Before you leave the office:  Get your blood work at the lab  Go to the front desk:   Schedule a routine office visit or check up to be done in  3 months Please be fasting    ------------- Start TUDORZA  Twice a day, use albuterol as needed  Take vitamin D OTC 1000 units every day  Avoid taking meloxicam, he may affect your stomach.  Take tonic water before bedtime to help leg cramps

## 2015-12-08 DIAGNOSIS — Z09 Encounter for follow-up examination after completed treatment for conditions other than malignant neoplasm: Secondary | ICD-10-CM | POA: Insufficient documentation

## 2015-12-08 LAB — CBC WITH DIFFERENTIAL/PLATELET
BASOS PCT: 1.8 % (ref 0.0–3.0)
Basophils Absolute: 0.2 10*3/uL — ABNORMAL HIGH (ref 0.0–0.1)
EOS PCT: 2.4 % (ref 0.0–5.0)
Eosinophils Absolute: 0.2 10*3/uL (ref 0.0–0.7)
HCT: 44.8 % (ref 39.0–52.0)
Hemoglobin: 14.6 g/dL (ref 13.0–17.0)
LYMPHS ABS: 2 10*3/uL (ref 0.7–4.0)
Lymphocytes Relative: 23.6 % (ref 12.0–46.0)
MCHC: 32.7 g/dL (ref 30.0–36.0)
MCV: 99.1 fl (ref 78.0–100.0)
MONO ABS: 0.7 10*3/uL (ref 0.1–1.0)
MONOS PCT: 8.6 % (ref 3.0–12.0)
NEUTROS PCT: 63.6 % (ref 43.0–77.0)
Neutro Abs: 5.4 10*3/uL (ref 1.4–7.7)
Platelets: 239 10*3/uL (ref 150.0–400.0)
RBC: 4.52 Mil/uL (ref 4.22–5.81)
RDW: 14.6 % (ref 11.5–15.5)
WBC: 8.6 10*3/uL (ref 4.0–10.5)

## 2015-12-08 LAB — TSH: TSH: 7.65 u[IU]/mL — ABNORMAL HIGH (ref 0.35–4.50)

## 2015-12-08 LAB — BASIC METABOLIC PANEL
BUN: 19 mg/dL (ref 6–23)
CHLORIDE: 108 meq/L (ref 96–112)
CO2: 26 meq/L (ref 19–32)
Calcium: 9 mg/dL (ref 8.4–10.5)
Creatinine, Ser: 1.08 mg/dL (ref 0.40–1.50)
GFR: 69.85 mL/min (ref >60.00)
GLUCOSE: 75 mg/dL (ref 70–99)
Potassium: 4.4 mEq/L (ref 3.5–5.1)
SODIUM: 142 meq/L (ref 135–145)

## 2015-12-08 NOTE — Assessment & Plan Note (Signed)
HTN: Seems to be well-controlled, check a BMP Hyperlipidemia: Refill Lipitor and check FLP on return to the office Hypothyroidism: Check a TSH, reports good compliance with meds Hypogonadism: Not taking HRT, if he decides to be treated, recommend to discuss with Dr. Everardo AllEllison Depression insomnia: Symptoms currently well controlled. History of PUD: Slightly TTP at the epigastric area, review of systems negative. Recommend to dc meloxicam. Check a CBC COPD: Refill albuterol,spiriva $$$, try Tudorza Low vitamin D: Recommend OTC vitamin D daily Weight loss, fatigue, sweats: It was an issue  for several months about a year ago, seems to be better. Leg cramps:  Recommend trial with tonic water Primary care: Declined a flu shot, benefits discussed RTC 3 months

## 2015-12-10 ENCOUNTER — Telehealth: Payer: Self-pay | Admitting: Internal Medicine

## 2015-12-10 MED ORDER — LEVOTHYROXINE SODIUM 125 MCG PO TABS: 125.0000 ug | ORAL_TABLET | Freq: Every day | ORAL | Status: DC

## 2015-12-10 NOTE — Telephone Encounter (Signed)
Pt is returning your call.    CB: [phone]

## 2015-12-10 NOTE — Telephone Encounter (Signed)
[facility] already left message as stated below and explained to Pt new dosage, then receive 5 back to back calls while in clinic and trying to get Pt's back.

## 2015-12-10 NOTE — Telephone Encounter (Addendum)
Spoke to patient who had questions regarding Tudorza. It was explained to pt that he was given a script for this medication at his last appointment.  He said he gave it to the pharmacy, but never heard back from them regarding the med.  Also reviewed with patient the changes to his synthroid.  He stated understanding and said that he would call pharmacy regarding both meds.  To ensure that patient receives medications, called pharmacy to see if they've received scripts for medication.  They said that they received the Rx for levothyroxine, but not the [place].  Called medication in. Called patient back to make him aware. Left a message for call back.

## 2015-12-10 NOTE — Addendum Note (Signed)
Addended by: Conrad BurlingtonANTER, Yasiel Goyne D on: 12/10/2015 10:54 AM   Modules accepted: Orders, Medications

## 2015-12-10 NOTE — Telephone Encounter (Signed)
Pt called very upset stating he called back after receiving msg left at 10:55am and has been waiting all afternoon for a return call. He doesn't understand the dosage change and requested a call back earlier. He states he's been waiting around all day and is going to have to do something about this. Pt verbalized wanting return call asap to explain in detail to him. Pt confirmed ph# of [phone].

## 2015-12-10 NOTE — Telephone Encounter (Signed)
Notes Recorded by Conrad BurlingtonKaylyn Liliana Brentlinger, CMA on 12/10/2015 at 10:55 AM [facility] result. Instructed him to increase dosage to 125 mcg daily [facility] instructed Pt to return to office in 6 weeks to recheck TSH. Notes Recorded by Conrad BurlingtonKaylyn Rafe Mackowski, CMA on 12/10/2015 at 10:17 AM Tried calling Pt, started to inform him of lab results. Telephone call disconnected. Notes Recorded by Wanda PlumpJose E Paz, MD on 12/10/2015 at 10:10 AM Advise patient: Labs okay except for the fact that he needs more synthroid Change from 75 mcg to 125 mcg 1 po qd 330, 2 RF Arrange a TSH in 6 weeks

## 2015-12-14 ENCOUNTER — Telehealth: Payer: Self-pay | Admitting: *Deleted

## 2015-12-14 NOTE — Telephone Encounter (Signed)
[employer] called to inform medication has been approved effective 12/14/15 expires 12/13/16 contact # [phone]

## 2015-12-14 NOTE — Telephone Encounter (Signed)
[name]; initiated electronically via Cover My Meds, awaiting decision/SLS

## 2015-12-14 NOTE — Telephone Encounter (Signed)
Approval information forwarded to pharmacy/SLS 12.27

## 2015-12-21 NOTE — Telephone Encounter (Signed)
[facility] that [facility] for Tudorza 400 mcg/ACT has been Approved and will be valid thru 12/13/2016; faxed approval to pharmacy/SLS

## 2016-01-18 ENCOUNTER — Ambulatory Visit (INDEPENDENT_AMBULATORY_CARE_PROVIDER_SITE_OTHER): Payer: Medicare Other | Admitting: Internal Medicine

## 2016-01-18 ENCOUNTER — Encounter: Payer: Self-pay | Admitting: Internal Medicine

## 2016-01-18 VITALS — BP 132/78 | HR 73 | Temp 98.4°F | Ht 63.0 in | Wt 139.5 lb

## 2016-01-18 DIAGNOSIS — R05 Cough: Secondary | ICD-10-CM

## 2016-01-18 DIAGNOSIS — E038 Other specified hypothyroidism: Secondary | ICD-10-CM | POA: Diagnosis not present

## 2016-01-18 DIAGNOSIS — E034 Atrophy of thyroid (acquired): Secondary | ICD-10-CM | POA: Diagnosis not present

## 2016-01-18 DIAGNOSIS — R059 Cough, unspecified: Secondary | ICD-10-CM

## 2016-01-18 MED ORDER — AZELASTINE HCL 0.1 % NA SOLN: 2.0000 | Freq: Every evening | NASAL | Status: DC | PRN

## 2016-01-18 NOTE — Progress Notes (Signed)
Pre visit review using our clinic review tool, if applicable. No additional management support is needed unless otherwise documented below in the visit note. 

## 2016-01-18 NOTE — Progress Notes (Signed)
Subjective:    Patient ID: Dakota [name], male    DOB: 06/16/1933, 82 y.o.   MRN: [id number]  DOS:  01/18/2016 Type of visit - description : Acute visit Interval history:  December 24 --- went to a urgent care with cough, no CXR was done, was diagnosed with pneumonia and took antibiotics. He felt much better, symptoms improve, he is here because he is having postnasal dripping for 2 or 3 weeks:  Discharge is cloudy, yellow, it pools on his throat every AM, he has cough >>> thinks from PND.   Review of Systems No fever chills Mild pain on the sinuses (frontal), some clear nasal discharge No chest pain or difficulty breathing Reports fatigue since he was diagnosed with pneumonia.   Past Medical History  Diagnosis Date  . Chronic neck pain     and HA, dx w/ cervical stenosis  . GERD (gastroesophageal reflux disease)   . COPD (chronic obstructive pulmonary disease) (HCC)   . Hyperlipidemia   . Peptic ulcer disease   . BPH (benign prostatic hypertrophy)   . Depression   . CAD (coronary artery disease)     per cath: mild/non obstructive 03/2006  . Osteopenia   . 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 Pulmonary nodule     s/p Ct x 2 last 05/2008: scarring  . Gallbladder & bile duct stone     per [place] 2007  . Diverticulosis of colon (without mention of hemorrhage)   . Hyperplastic colon polyp   . Hypertension   . Thyroid disease   . Lumbar spondylosis     Facet mediated pain, Dr. Ethelene Hal    Past Surgical History  Procedure Laterality Date  . Arthoscopic rotaor cuff repair      L-2003, R-2004 Dr Ranell Patrick  . Inguinal hernia repair      r  . Ulcer surgery  1978  . Spinal cord stimulator insertion  5/11    lower back  . Facet joint injection  2016    Bilateral L4-5 and L5-S1, Dr. Ethelene Hal    Social History   Social History  . Marital Status: Married    Spouse Name: N/A  . Number of Children: 2  . Years of Education: N/A   Occupational History  .  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 Smoking status: Former Games developer  . Smokeless tobacco: Never Used     Comment: 2011 aprox  . Alcohol Use: 1.2 oz/week    0 Standard drinks or equivalent, 2 Shots of liquor per week     Comment: 2 mixed drinks a week  . Drug Use: No  . Sexual Activity: No   Other Topics Concern  . Not on file   Social History Narrative   Lives w/ wife   daugther is medical PA              Medication List       This list is accurate as of: 01/18/16 11:59 PM.  Always use your most recent med list.               Aclidinium Bromide 400 MCG/ACT Aepb  Inhale 1 puff into the lungs 2 (two) times daily.     albuterol 108 (90 Base) MCG/ACT inhaler  Commonly known as:  PROAIR HFA  Inhale 2  puffs into the lungs every 6 (six) hours as needed for wheezing or shortness of breath.     atorvastatin 20 MG tablet  Commonly known as:  LIPITOR  Take 1 tablet (20 mg total) by mouth at bedtime.     azelastine 0.1 % nasal spray  Commonly known as:  ASTELIN  Place 2 sprays into both nostrils at bedtime as needed for rhinitis. Use in each nostril as directed     CYCLOSET 0.8 MG Tabs  Generic drug:  Bromocriptine Mesylate  Take 0.8 mg by mouth daily.     HYDROmorphone 8 MG tablet  Commonly known as:  DILAUDID     levothyroxine 125 MCG tablet  Commonly known as:  SYNTHROID, LEVOTHROID  Take 1 tablet (125 mcg total) by mouth daily before breakfast.     NEXIUM PO  Take by mouth. OTC , takes one every AM     venlafaxine XR 75 MG 24 hr capsule  Commonly known as:  EFFEXOR-XR     zolpidem 5 MG tablet  Commonly known as:  AMBIEN  Take 1 tablet (5 mg total) by mouth at bedtime as needed for sleep.           Objective:   Physical Exam BP 132/78 mmHg  Pulse 73  Temp(Src) 98.4 F (36.9 C) (Oral)  Ht  (1.6 m)  Wt 139 lb 8 oz (63.277 kg)  BMI 24.72 kg/m2  SpO2 94% General:   Well  developed, well nourished . NAD.  HEENT:  Normocephalic . Face symmetric, atraumatic. TMs normal, nose slightly congested, sinuses slightly TTP at the left maxillary area. Lungs:  Scattered rhonchi, no wheezing, no crackles Normal respiratory effort, no intercostal retractions, no accessory muscle use. Heart: RRR,  no murmur.  No pretibial edema bilaterally  Skin: Not pale. Not jaundice Neurologic:  alert & oriented X3.  Speech normal, gait appropriate for age and unassisted Psych--  Cognition and judgment appear intact.  Cooperative with normal attention span and concentration.  Behavior appropriate. No anxious or depressed appearing.      Assessment & Plan:   Assessment: Hyperlipidemia Hypothyroidism Hypogonadism-- not on HRT Depression, insomnia (effexor, ambien; xanax dc 12-2015,not using it) COPD GI:  GERD, PUD, diverticulosis, colon polyps Osteopenia CV: Aneurysm, left ICA 2 mm 2009, saw neurology Mild non-obstructive CAD per cath 2007 MSK: ---Pain med rx by Dr Ethelene Hal --Lumbar spondylosis --Spinal cord stimulation 2011 --Chronic neck pain and headache, DX cervical stenosis H/o Weight loss, fatigue, sweats: On and off symptoms since at least 2013, see previous entries  Palpable aorta, ultrasound (-) H/o vit d def ?PMR 2013   PLAN: Cough: Likely due to postnasal dripping. Recommend Flonase, Astelin. mucinex DM as needed. If symptoms continue he will call. Abx ? He is a slightly tender at the left maxillary area. Hypothyroidism: Check a TSH, Synthroid was increased recently. RTC: 03/06/2016 as sched

## 2016-01-18 NOTE — Patient Instructions (Signed)
BEFORE YOU LEAVE THE OFFICE:  GO TO THE LAB : Get the blood work         AFTER YOU LEAVE THE OFFICE:  Flonase: 2 sprays on each side of the nose  Every morning.(OTC) Astelin 2 sprays on each side of the nose every nigh (prescription sent) Mucinex DM OTC 1 tablet twice a day as needed Okay to take Claritin as needed at bedtime. Call if not improving in the next few days

## 2016-01-19 LAB — TSH: TSH: 11.47 u[IU]/mL — ABNORMAL HIGH (ref 0.35–4.50)

## 2016-01-20 ENCOUNTER — Telehealth: Payer: Self-pay | Admitting: Internal Medicine

## 2016-01-20 NOTE — Telephone Encounter (Signed)
Pt is returning your call for results.    CB: [phone]

## 2016-01-21 MED ORDER — LEVOTHYROXINE SODIUM 100 MCG PO TABS: 100.0000 ug | ORAL_TABLET | Freq: Every day | ORAL | Status: DC

## 2016-01-21 NOTE — Addendum Note (Signed)
Addended by: Conrad H. [place] D on: 01/21/2016 09:51 AM   Modules accepted: Orders, Medications

## 2016-01-21 NOTE — Telephone Encounter (Signed)
Notes Recorded by Wanda Plump, MD on 01/21/2016 at 9:50 AM Spoke w/ pt, reports a 125 mcg make him dizzy, currently taking 75 mcg daily. Plan: Increase to synthroid 100 mcg, 1 daily, labs in 2 months. Notes Recorded by Conrad [place], CMA on 01/20/2016 at 1:28 PM [facility] informing Pt to return call regarding lab results. Notes Recorded by Wanda Plump, MD on 01/19/2016 at 3:11 PM Please call the patient, confirm he is taking Synthroid 125 mcg qd, let me know

## 2016-01-21 NOTE — Telephone Encounter (Signed)
Great, thx!

## 2016-01-21 NOTE — Telephone Encounter (Signed)
Synthroid 100 mcg 1 tablet daily sent to [facility] pharmacy. Pt has F/U appt scheduled for 03/06/2016. Requesting TSH check then.

## 2016-02-07 ENCOUNTER — Telehealth: Payer: Self-pay

## 2016-02-07 MED ORDER — ZOLPIDEM TARTRATE 5 MG PO TABS: 5.0000 mg | ORAL_TABLET | Freq: Every evening | ORAL | Status: DC | PRN

## 2016-02-07 NOTE — Telephone Encounter (Signed)
Rx printed, awaiting MD signature.  

## 2016-02-07 NOTE — Telephone Encounter (Signed)
Okay #90 and 3  refills 

## 2016-02-07 NOTE — Telephone Encounter (Signed)
Rx faxed to [facility]

## 2016-02-07 NOTE — Telephone Encounter (Signed)
Pt is requesting refill on Ambien.  Last OV:01/18/2016  Last Fill: 11/30/2015 #30 and 1RF UDS: Not needed  Please advise.

## 2016-03-06 ENCOUNTER — Other Ambulatory Visit: Payer: Self-pay

## 2016-03-06 ENCOUNTER — Ambulatory Visit (INDEPENDENT_AMBULATORY_CARE_PROVIDER_SITE_OTHER): Payer: Medicare Other | Admitting: Internal Medicine

## 2016-03-06 ENCOUNTER — Encounter: Payer: Self-pay | Admitting: Internal Medicine

## 2016-03-06 VITALS — BP 126/66 | HR 77 | Temp 97.9°F | Ht 63.0 in | Wt 140.2 lb

## 2016-03-06 DIAGNOSIS — R05 Cough: Secondary | ICD-10-CM | POA: Diagnosis not present

## 2016-03-06 DIAGNOSIS — E039 Hypothyroidism, unspecified: Secondary | ICD-10-CM | POA: Diagnosis not present

## 2016-03-06 DIAGNOSIS — Z09 Encounter for follow-up examination after completed treatment for conditions other than malignant neoplasm: Secondary | ICD-10-CM

## 2016-03-06 DIAGNOSIS — R739 Hyperglycemia, unspecified: Secondary | ICD-10-CM | POA: Diagnosis not present

## 2016-03-06 DIAGNOSIS — E785 Hyperlipidemia, unspecified: Secondary | ICD-10-CM | POA: Diagnosis not present

## 2016-03-06 DIAGNOSIS — R059 Cough, unspecified: Secondary | ICD-10-CM

## 2016-03-06 MED ORDER — CEFUROXIME AXETIL 500 MG PO TABS: 500.0000 mg | ORAL_TABLET | Freq: Two times a day (BID) | ORAL | Status: DC

## 2016-03-06 MED ORDER — PREDNISONE 10 MG PO TABS: ORAL_TABLET | ORAL | Status: DC

## 2016-03-06 NOTE — Progress Notes (Signed)
Pre visit review using our clinic review tool, if applicable. No additional management support is needed unless otherwise documented below in the visit note. 

## 2016-03-06 NOTE — Patient Instructions (Signed)
GO TO THE FRONT DESK  Schedule labs to be done this week, fasting   Schedule your next appointment for a  Follow up When?   1 month Fasting?  No  ------------------------------------------------  For sinusitis: Ceftin for 10 days Prednisone as prescribed Continue Astelin nasal spray  Flonase OTC 2 sprays every day in the morning

## 2016-03-06 NOTE — Assessment & Plan Note (Signed)
Prediabetes: Check up A1c Hyperlipidemia: check a FLP, AST, ALT. Continue Lipitor Hypothyroidism: On Synthroid 100 mg, couldn't tolerate a higher  dose, check a TSH Cough-- most likely due to sinusitis (see physical exam);  COPD does not seem to be exacerbated. Will prescribe ceftin, prednisone, continue Astelin, Flonase. reassess in one month Wt loss, fatigue and sweats: Does not seem to be an issue at this time  Hypothyroidism: Check a PSA RTC one month

## 2016-03-06 NOTE — Progress Notes (Signed)
Subjective:    Patient ID: Dakota [name], male    DOB: 09/25/1933, 82 y.o.   MRN: [id number]  DOS:  03/06/2016 Type of visit - description : Routine office visit Interval history: His main concern today remains cough, he is coughing often, thinks is triggered by mucus pooling in the throat, yellow in color, very thick. Medication list reviewed: Good compliance.  Wt Readings from Last 3 Encounters:  03/06/16 140 lb 4 oz (63.617 kg)  01/18/16 139 lb 8 oz (63.277 kg)  12/07/15 143 lb 8 oz (65.091 kg)     Review of Systems  Denies fever chills. No sore throat per se No nausea, vomiting, diarrhea Some  wheezing but otherwise no chest congestion. + nasal congestion  Past Medical History  Diagnosis Date  . Chronic neck pain     and HA, dx w/ cervical stenosis  . GERD (gastroesophageal reflux disease)   . COPD (chronic obstructive pulmonary disease) (HCC)   . Hyperlipidemia   . Peptic ulcer disease   . BPH (benign prostatic hypertrophy)   . Depression   . CAD (coronary artery disease)     per cath: mild/non obstructive 03/2006  . Osteopenia   . 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 Pulmonary nodule     s/p Ct x 2 last 05/2008: scarring  . Gallbladder & bile duct stone     per [place] 2007  . Diverticulosis of colon (without mention of hemorrhage)   . Hyperplastic colon polyp   . Hypertension   . Thyroid disease   . Lumbar spondylosis     Facet mediated pain, Dr. Ethelene Halamos    Past Surgical History  Procedure Laterality Date  . Arthoscopic rotaor cuff repair      L-2003, R-2004 Dr Ranell PatrickNorris  . Inguinal hernia repair      r  . Ulcer surgery  1978  . Spinal cord stimulator insertion  5/11    lower back  . Facet joint injection  2016    Bilateral L4-5 and L5-S1, Dr. Ethelene Halamos    Social History   Social History  . Marital Status: Married    Spouse Name: N/A  . Number of Children: 2  . Years of Education: N/A   Occupational History  . 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 Smoking status: Former Games developermoker  . Smokeless tobacco: Never Used     Comment: 2011 aprox  . Alcohol Use: 1.2 oz/week    0 Standard drinks or equivalent, 2 Shots of liquor per week     Comment: 2 mixed drinks a week  . Drug Use: No  . Sexual Activity: No   Other Topics Concern  . Not on file   Social History Narrative   Lives w/ wife   daugther is medical PA              Medication List       This list is accurate as of: 03/06/16  5:42 PM.  Always use your most recent med list.               Aclidinium Bromide 400 MCG/ACT Aepb  Inhale 1 puff into the lungs 2 (two) times daily.     albuterol 108 (90 Base) MCG/ACT inhaler  Commonly known as:  PROAIR HFA  Inhale 2 puffs into the  lungs every 6 (six) hours as needed for wheezing or shortness of breath.     atorvastatin 20 MG tablet  Commonly known as:  LIPITOR  Take 1 tablet (20 mg total) by mouth at bedtime.     azelastine 0.1 % nasal spray  Commonly known as:  ASTELIN  Place 2 sprays into both nostrils at bedtime as needed for rhinitis. Use in each nostril as directed     cefUROXime 500 MG tablet  Commonly known as:  CEFTIN  Take 1 tablet (500 mg total) by mouth 2 (two) times daily with a meal.     CYCLOSET 0.8 MG Tabs  Generic drug:  Bromocriptine Mesylate  Take 0.8 mg by mouth daily.     HYDROmorphone 8 MG tablet  Commonly known as:  DILAUDID     levothyroxine 100 MCG tablet  Commonly known as:  SYNTHROID, LEVOTHROID  Take 1 tablet (100 mcg total) by mouth daily before breakfast.     NEXIUM PO  Take by mouth. OTC , takes one every AM     predniSONE 10 MG tablet  Commonly known as:  DELTASONE  4 tablets x 2 days, 3 tabs x 2 days, 2 tabs x 2 days, 1 tab x 2 days     venlafaxine XR 75 MG 24 hr capsule  Commonly known as:  EFFEXOR-XR     zolpidem 5 MG tablet  Commonly known as:  AMBIEN  Take 1 tablet (5 mg total)  by mouth at bedtime as needed for sleep.           Objective:   Physical Exam BP 126/66 mmHg  Pulse 77  Temp(Src) 97.9 F (36.6 C) (Oral)  Ht  (1.6 m)  Wt 140 lb 4 oz (63.617 kg)  BMI 24.85 kg/m2  SpO2 91%    General:   Well developed, well nourished . NAD.  HEENT:  Normocephalic . Face symmetric, atraumatic. TMs: Normal Throat symmetric, no red Sinuses: TTP bilaterally Nose is slightly congested Lungs:  CTA B Normal respiratory effort, no intercostal retractions, no accessory muscle use. Heart: RRR,  no murmur.  No pretibial edema bilaterally  Skin: Not pale. Not jaundice Neurologic:  alert & oriented X3.  Speech normal, gait appropriate for age and unassisted Psych--  Cognition and judgment appear intact.  Cooperative with normal attention span and concentration.  Behavior appropriate. No anxious or depressed appearing.       Assessment & Plan:   Assessment: Prediabetes Hyperlipidemia Hypothyroidism Hypogonadism-- not on HRT Depression, insomnia (effexor, ambien; xanax dc 12-2015,not using them) COPD GI:   ---GERD, PUD, diverticulosis, colon polyps Elevated LFTs ---Osteopenia CV: Aneurysm, left ICA 2 mm 2009, saw neurology Mild non-obstructive CAD per cath 2007 MSK: ---Pain med rx by Dr Ethelene Hal --Lumbar spondylosis --Spinal cord stimulation 2011 --Chronic neck pain and headache, DX cervical stenosis H/o Weight loss, fatigue, sweats: On and off symptoms since at least 2013, see previous entries  Palpable aorta, ultrasound (-) H/o vit d def ?PMR 2013   PLAN: Prediabetes: Check up A1c Hyperlipidemia: check a FLP, AST, ALT. Continue Lipitor Hypothyroidism: On Synthroid 100 mg, couldn't tolerate a higher  dose, check a TSH Cough-- most likely due to sinusitis (see physical exam);  COPD does not seem to be exacerbated. Will prescribe ceftin, prednisone, continue Astelin, Flonase. reassess in one month Wt loss, fatigue and sweats: Does not seem to  be an issue at this time  RTC one month

## 2016-03-08 ENCOUNTER — Other Ambulatory Visit: Payer: Medicare Other

## 2016-03-10 ENCOUNTER — Other Ambulatory Visit (INDEPENDENT_AMBULATORY_CARE_PROVIDER_SITE_OTHER): Payer: Medicare Other

## 2016-03-10 DIAGNOSIS — E785 Hyperlipidemia, unspecified: Secondary | ICD-10-CM

## 2016-03-10 DIAGNOSIS — R739 Hyperglycemia, unspecified: Secondary | ICD-10-CM | POA: Diagnosis not present

## 2016-03-10 DIAGNOSIS — E039 Hypothyroidism, unspecified: Secondary | ICD-10-CM | POA: Diagnosis not present

## 2016-03-10 LAB — LIPID PANEL
CHOL/HDL RATIO: 2
CHOLESTEROL: 139 mg/dL (ref 0–200)
HDL: 63.2 mg/dL (ref >39.00)
LDL CALC: 61 mg/dL (ref 0–99)
NONHDL: 75.57
Triglycerides: 72 mg/dL (ref 0.0–149.0)
VLDL: 14.4 mg/dL (ref 0.0–40.0)

## 2016-03-10 LAB — TSH: TSH: 0.31 u[IU]/mL — AB (ref 0.35–4.50)

## 2016-03-10 LAB — AST: AST: 21 U/L (ref 0–37)

## 2016-03-10 LAB — ALT: ALT: 16 U/L (ref 0–53)

## 2016-03-10 LAB — HEMOGLOBIN A1C: HEMOGLOBIN A1C: 5.9 % (ref 4.6–6.5)

## 2016-03-20 NOTE — Addendum Note (Signed)
Addended byConrad : Kailyn Vanderslice D on: 03/20/2016 09:59 AM   Modules accepted: Orders

## 2016-03-28 ENCOUNTER — Ambulatory Visit (HOSPITAL_BASED_OUTPATIENT_CLINIC_OR_DEPARTMENT_OTHER)
Admission: RE | Admit: 2016-03-28 | Discharge: 2016-03-28 | Disposition: A | Discharge status: Home / Self Care | Payer: Medicare Other | Source: Ambulatory Visit | Attending: Internal Medicine | Admitting: Internal Medicine

## 2016-03-28 ENCOUNTER — Encounter: Payer: Self-pay | Admitting: Internal Medicine

## 2016-03-28 ENCOUNTER — Ambulatory Visit (INDEPENDENT_AMBULATORY_CARE_PROVIDER_SITE_OTHER): Payer: Medicare Other | Admitting: Internal Medicine

## 2016-03-28 VITALS — BP 124/80 | HR 72 | Temp 98.0°F | Ht 63.0 in | Wt 139.1 lb

## 2016-03-28 DIAGNOSIS — R05 Cough: Secondary | ICD-10-CM

## 2016-03-28 DIAGNOSIS — J449 Chronic obstructive pulmonary disease, unspecified: Secondary | ICD-10-CM

## 2016-03-28 DIAGNOSIS — Z09 Encounter for follow-up examination after completed treatment for conditions other than malignant neoplasm: Secondary | ICD-10-CM

## 2016-03-28 DIAGNOSIS — R059 Cough, unspecified: Secondary | ICD-10-CM

## 2016-03-28 MED ORDER — MOMETASONE FURO-FORMOTEROL FUM 100-5 MCG/ACT IN AERO: 2.0000 | INHALATION_SPRAY | Freq: Two times a day (BID) | RESPIRATORY_TRACT | Status: DC

## 2016-03-28 NOTE — Patient Instructions (Signed)
GO TO THE FRONT DESK Schedule your next appointment for a  checkup in one month, no fasting    STOP BY THE FIRST FLOOR:  get the XR    Same medications, add Dulera, try to get a free trial  Synthroid  100 mcg: One tablet every day except Tuesday and Thursday take only half tablet

## 2016-03-28 NOTE — Assessment & Plan Note (Addendum)
Cough: Continue with cough. Physical exam with evidence of sinus congestion (from allergies or sinusitis) and also evidence of possible pneumonia. Improved temporarily with ABX and prednisone.. Plan --get a chest x-ray, treat with antibiotics if appropriate, refer to pulmonary (previous referrals and PFTs failed) --CT sinuses if chest x-ray negative --cont Tudorza, Add dulera Addendum: Chest x-ray with no pneumonia -->sinus CT COPD: as above Hypothyroidism: Based on last TSH we decrease Synthroid 100 mg twice a day except Tuesday and Thursday take half tablet, see AVS

## 2016-03-28 NOTE — Progress Notes (Signed)
Subjective:    Patient ID: Dakota [name], male    DOB: 02/09/1933, 82 y.o.   MRN: [id number]  DOS:  03/28/2016 Type of visit - description : Acute visit Interval history: Since the last time he was here, his sx  improved temporarily . Continue with cough, again he feels related to postnasal dripping. He also has nasal discharge but PND is more noticeable. He has some wheezing in the chest He is able to cough up phlegm, he thinks from the nose-PND not from the lungs. Good compliance with nasal sprays, on PPIs.   Review of Systems No fever chills No itchy eyes or nose except the morning, he has some eye discharge.  No chest pain or difficulty breathing perse  Past Medical History  Diagnosis Date  . Chronic neck pain     and HA, dx w/ cervical stenosis  . GERD (gastroesophageal reflux disease)   . COPD (chronic obstructive pulmonary disease) (HCC)   . Hyperlipidemia   . Peptic ulcer disease   . BPH (benign prostatic hypertrophy)   . Depression   . CAD (coronary artery disease)     per cath: mild/non obstructive 03/2006  . Osteopenia   . 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 Pulmonary nodule     s/p Ct x 2 last 05/2008: scarring  . Gallbladder & bile duct stone     per US 2007  . Diverticulosis of colon (without mention of hemorrhage)   . Hyperplastic colon polyp   . Hypertension   . Thyroid disease   . Lumbar spondylosis     Facet mediated pain, Dr. Ethelene Halamos    Past Surgical History  Procedure Laterality Date  . Arthoscopic rotaor cuff repair      L-2003, R-2004 Dr Ranell PatrickNorris  . Inguinal hernia repair      r  . Ulcer surgery  1978  . Spinal cord stimulator insertion  5/11    lower back  . Facet joint injection  2016    Bilateral L4-5 and L5-S1, Dr. Ethelene Halamos    Social History   Social History  . Marital Status: Married    Spouse Name: N/A  . Number of Children: 2  . Years of Education: N/A   Occupational History  . 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 part time     school crossing guard   Social History Main Topics  . Smoking status: Former Games developermoker  . Smokeless tobacco: Never Used     Comment: 2011 aprox  . Alcohol Use: 1.2 oz/week    0 Standard drinks or equivalent, 2 Shots of liquor per week     Comment: 2 mixed drinks a week  . Drug Use: No  . Sexual Activity: No   Other Topics Concern  . Not on file   Social History Narrative   Lives w/ wife   daugther is medical PA              Medication List       This list is accurate as of: 03/28/16  6:11 PM.  Always use your most recent med list.               Aclidinium Bromide 400 MCG/ACT Aepb  Inhale 1 puff into the lungs 2 (two) times daily.     albuterol 108 (90 Base) MCG/ACT inhaler  Commonly known as:  PROAIR HFA  Inhale 2 puffs  into the lungs every 6 (six) hours as needed for wheezing or shortness of breath.     atorvastatin 20 MG tablet  Commonly known as:  LIPITOR  Take 1 tablet (20 mg total) by mouth at bedtime.     azelastine 0.1 % nasal spray  Commonly known as:  ASTELIN  Place 2 sprays into both nostrils at bedtime as needed for rhinitis. Use in each nostril as directed     CYCLOSET 0.8 MG Tabs  Generic drug:  Bromocriptine Mesylate  Take 0.8 mg by mouth daily.     HYDROmorphone 8 MG tablet  Commonly known as:  DILAUDID     levothyroxine 100 MCG tablet  Commonly known as:  SYNTHROID, LEVOTHROID  Take by mouth daily before breakfast. 1 tablet every day except Tuesday and Thursday take only half tablet     mometasone-formoterol 100-5 MCG/ACT Aero  Commonly known as:  DULERA  Inhale 2 puffs into the lungs 2 (two) times daily.     NEXIUM PO  Take by mouth. OTC , takes one every AM     venlafaxine XR 75 MG 24 hr capsule  Commonly known as:  EFFEXOR-XR     zolpidem 5 MG tablet  Commonly known as:  AMBIEN  Take 1 tablet (5 mg total) by mouth at bedtime as needed for sleep.           Objective:   Physical Exam BP 124/80 mmHg   Pulse 72  Temp(Src) 98 F (36.7 C) (Oral)  Ht  (1.6 m)  Wt 139 lb 2 oz (63.107 kg)  BMI 24.65 kg/m2  SpO2 95% General:   Well developed, well nourished . NAD.  HEENT:  Normocephalic . Face symmetric, atraumatic. TMs normal, nose congested, throat no discharge. Sinuses: Slightly TTP on the left Lungs:  Few rhonchi and end expiratory wheezes bilaterally. Crackles at R>>L  base?  Normal respiratory effort, no intercostal retractions, no accessory muscle use. Heart: RRR,  no murmur.  No pretibial edema bilaterally  Skin: Not pale. Not jaundice Neurologic:  alert & oriented X3.  Speech normal, gait appropriate for age and unassisted Psych--  Cognition and judgment appear intact.  Cooperative with normal attention span and concentration.  Behavior appropriate. No anxious or depressed appearing.      Assessment & Plan:   Assessment: Prediabetes Hyperlipidemia Hypothyroidism Hypogonadism-- not on HRT Depression, insomnia (effexor, ambien; xanax dc 12-2015,not using them) COPD GI:   ---GERD, PUD, diverticulosis, colon polyps ---Elevated LFTs Osteopenia CV: Aneurysm, left ICA 2 mm 2009, saw neurology Mild non-obstructive CAD per cath 2007 MSK: ---Pain med rx by Dr Ethelene Hal --Lumbar spondylosis --Spinal cord stimulation 2011 --Chronic neck pain and headache, DX cervical stenosis H/o Weight loss, fatigue, sweats: On and off symptoms since at least 2013, see previous entries  Palpable aorta, ultrasound (-) H/o vit d def ?PMR 2013   PLAN: Cough: Continue with cough. Physical exam with evidence of sinus congestion (from allergies or sinusitis) and also evidence of possible pneumonia. Improved temporarily with ABX and prednisone.. Plan --get a chest x-ray, treat with antibiotics if appropriate, refer to pulmonary (previous referrals and PFTs failed) --CT sinuses if chest x-ray negative --cont Tudorza, Add dulera Addendum: Chest x-ray with no pneumonia -->sinus  CT COPD: as above Hypothyroidism: Based on last TSH we decrease Synthroid 100 mg twice a day except Tuesday and Thursday take half tablet, see AVS RTC one month

## 2016-03-28 NOTE — Progress Notes (Signed)
Pre visit review using our clinic review tool, if applicable. No additional management support is needed unless otherwise documented below in the visit note. 

## 2016-03-30 ENCOUNTER — Ambulatory Visit (HOSPITAL_BASED_OUTPATIENT_CLINIC_OR_DEPARTMENT_OTHER)
Admission: RE | Admit: 2016-03-30 | Discharge: 2016-03-30 | Disposition: A | Discharge status: Home / Self Care | Payer: Medicare Other | Source: Ambulatory Visit | Attending: Internal Medicine | Admitting: Internal Medicine

## 2016-03-30 ENCOUNTER — Other Ambulatory Visit: Payer: Self-pay

## 2016-03-30 DIAGNOSIS — J449 Chronic obstructive pulmonary disease, unspecified: Secondary | ICD-10-CM

## 2016-03-30 DIAGNOSIS — R05 Cough: Secondary | ICD-10-CM | POA: Insufficient documentation

## 2016-03-30 DIAGNOSIS — R059 Cough, unspecified: Secondary | ICD-10-CM

## 2016-04-06 ENCOUNTER — Institutional Professional Consult (permissible substitution): Payer: Medicare Other | Admitting: Internal Medicine

## 2016-04-09 IMAGING — CR DG RIBS 2V*L*
2 series · 2 of 2 positions shown · non-contrast
Comparison: PA and lateral chest x-ray January 21, 2015 and
portable chest x-ray dated August 23, 2012.

CLINICAL DATA: Status post fall 4 days ago with persistent
posterior left lower rib pain, history of COPD.

EXAM:
LEFT RIBS - 2 VIEW

[view not recorded (1 of 2)]
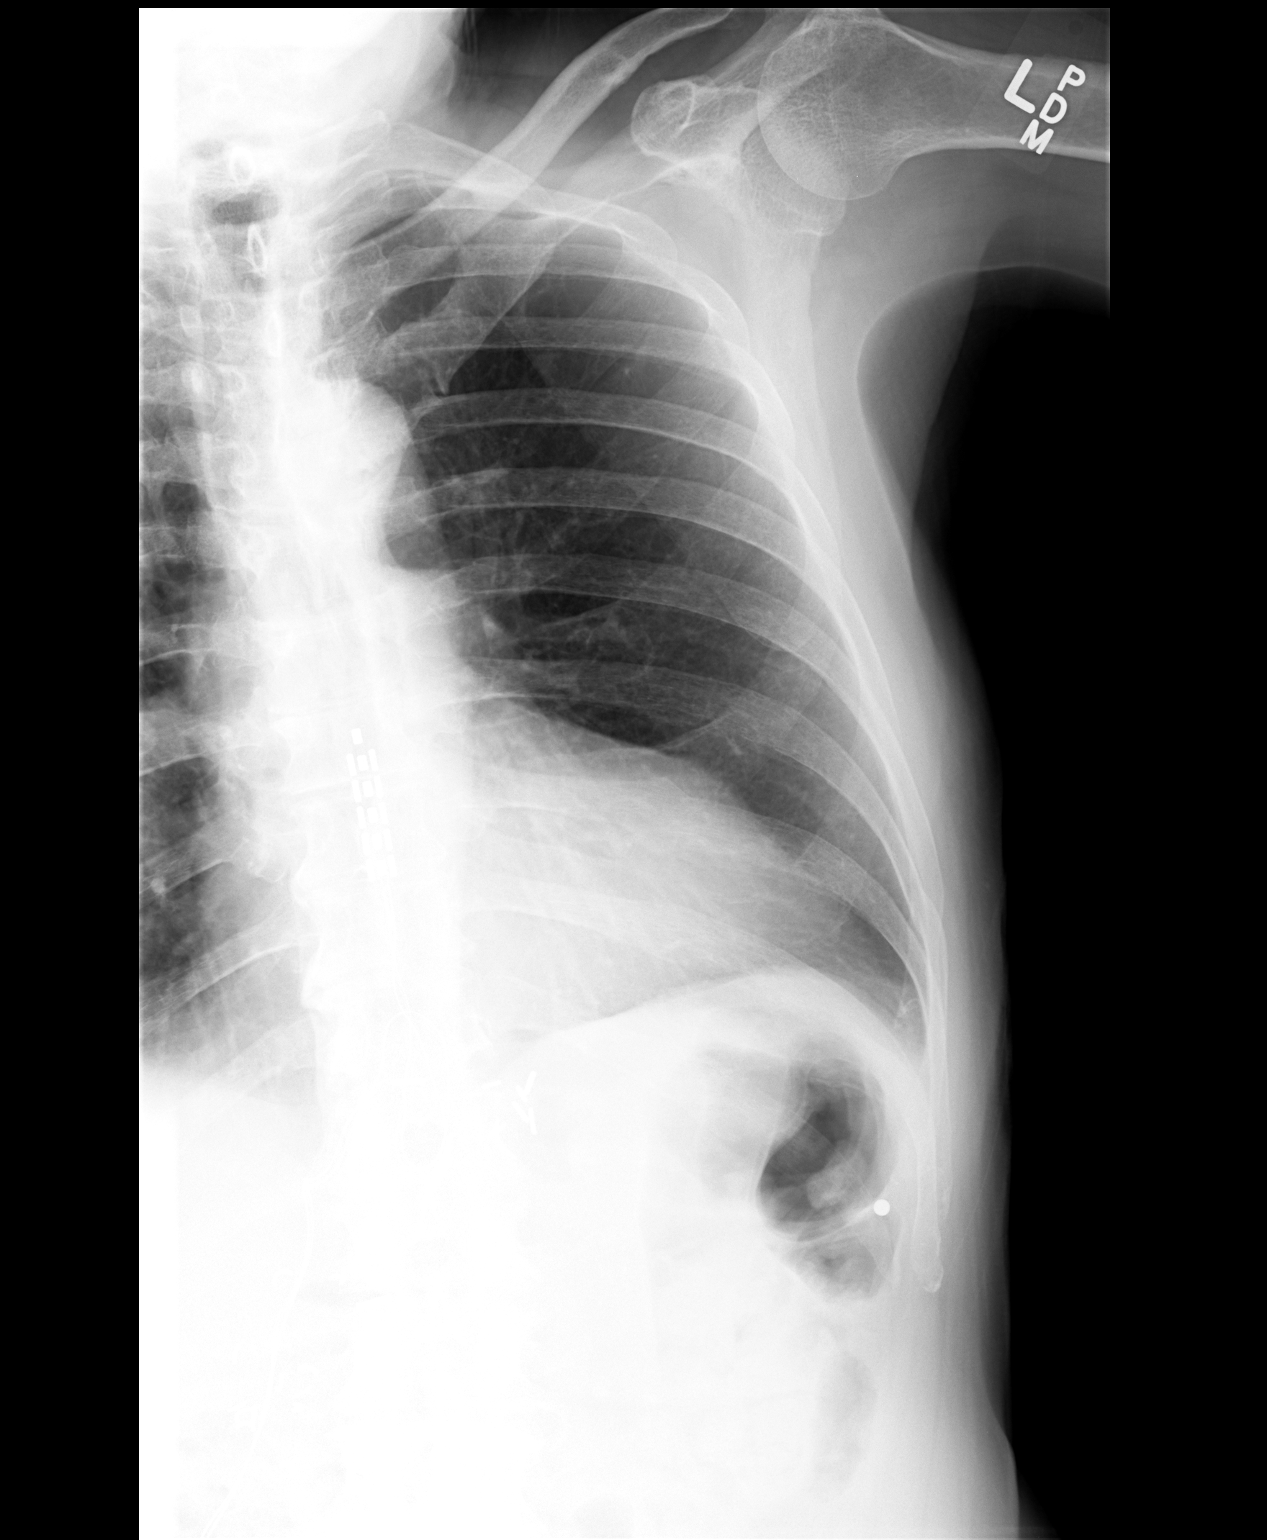

[view not recorded (2 of 2)]
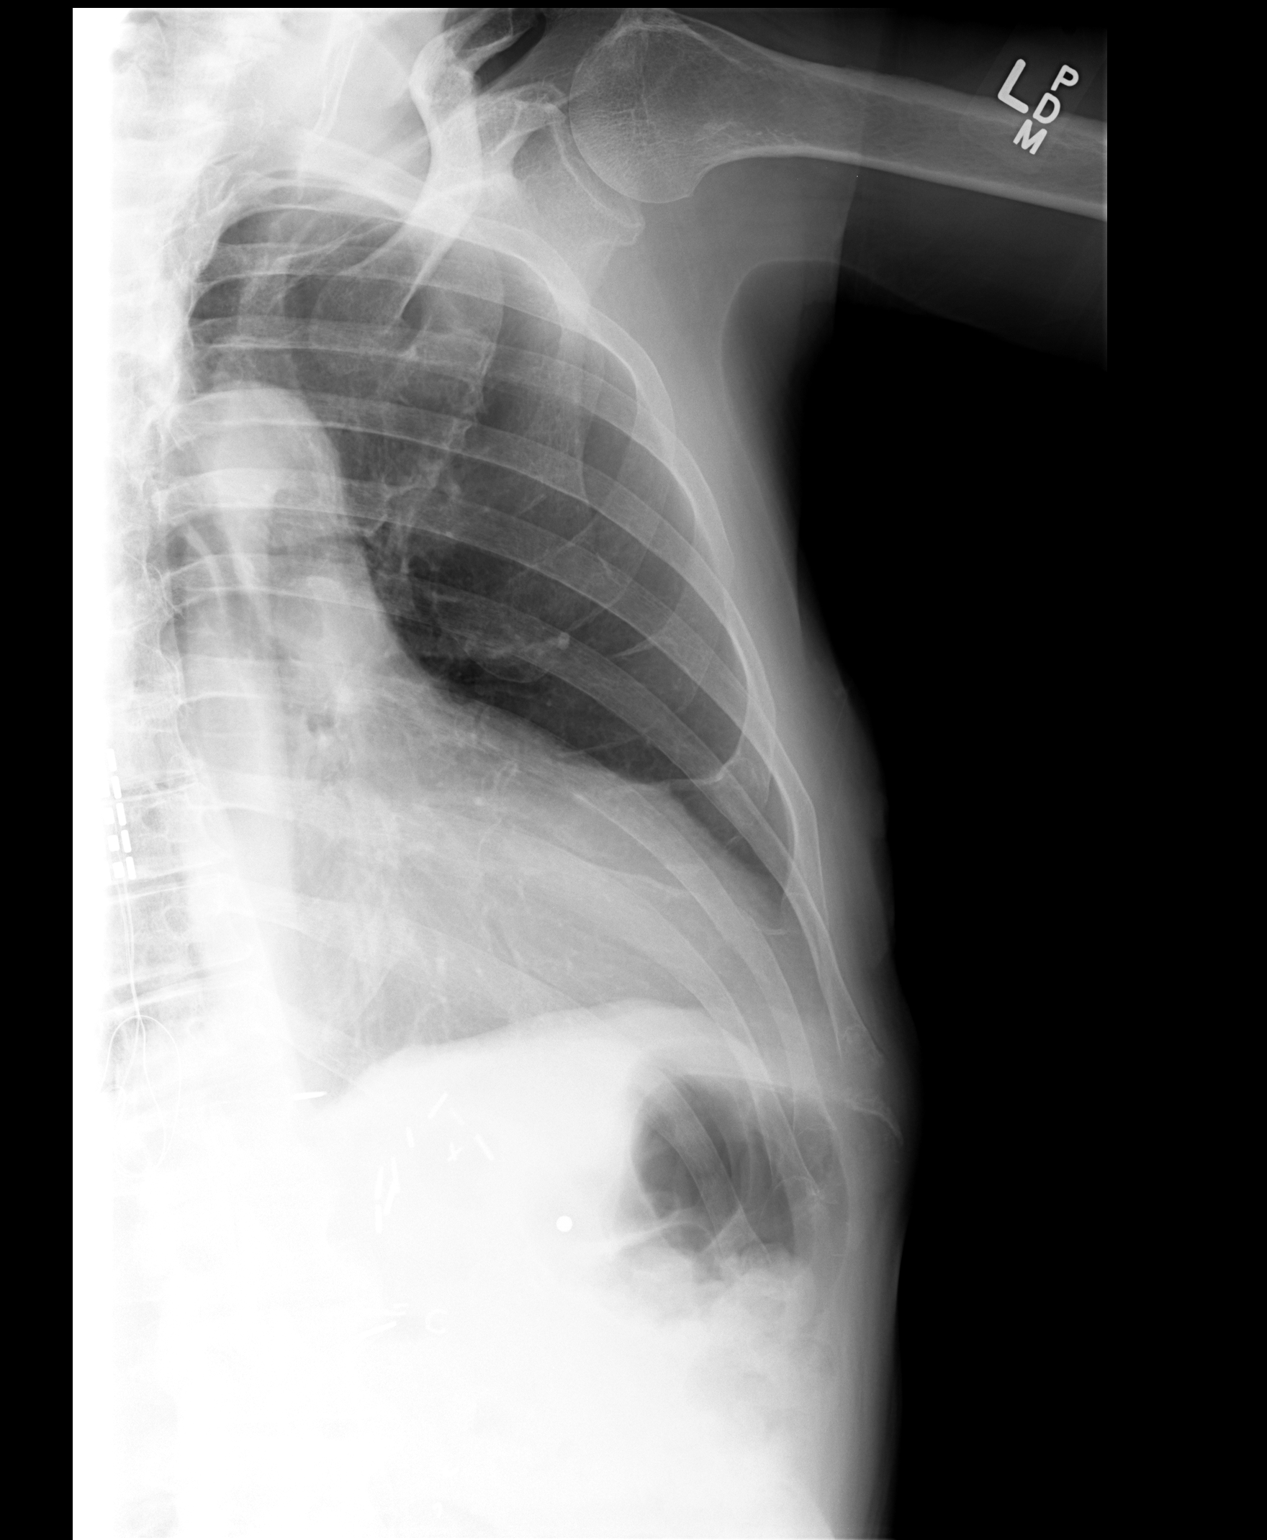

[2 of 2 positions shown; findings below may reference images not displayed]

FINDINGS: There is subtle contour deformity of the lateral aspect of the left
eighth rib which was visible on the previous studies. There is no
acute rib fracture demonstrated. There is no visible pleural
effusion or pneumothorax.
IMPRESSION: There is old deformity of the anterolateral aspect of the left
eighth rib. No acute left rib fracture is demonstrated.

## 2016-04-10 ENCOUNTER — Ambulatory Visit: Payer: Medicare Other | Admitting: Internal Medicine

## 2016-04-21 ENCOUNTER — Telehealth: Payer: Self-pay | Admitting: Internal Medicine

## 2016-04-21 ENCOUNTER — Ambulatory Visit: Payer: Medicare Other | Admitting: Internal Medicine

## 2016-04-21 NOTE — Telephone Encounter (Signed)
Patient canceled 2:30pm appointment today patient states he would like to see pulmonologist prior to follow up with PCP. Patient states he doesn't feel well scheduled him a follow up for 04/24/16. Charge or no charge

## 2016-04-21 NOTE — Telephone Encounter (Signed)
No charge (pulmonary referral has been sent already)

## 2016-04-24 ENCOUNTER — Ambulatory Visit (INDEPENDENT_AMBULATORY_CARE_PROVIDER_SITE_OTHER): Payer: Medicare Other | Admitting: Internal Medicine

## 2016-04-24 ENCOUNTER — Encounter: Payer: Self-pay | Admitting: Internal Medicine

## 2016-04-24 VITALS — BP 138/82 | HR 70 | Temp 98.1°F | Ht 63.0 in | Wt 140.5 lb

## 2016-04-24 DIAGNOSIS — R059 Cough, unspecified: Secondary | ICD-10-CM

## 2016-04-24 DIAGNOSIS — E039 Hypothyroidism, unspecified: Secondary | ICD-10-CM | POA: Diagnosis not present

## 2016-04-24 DIAGNOSIS — J449 Chronic obstructive pulmonary disease, unspecified: Secondary | ICD-10-CM | POA: Diagnosis not present

## 2016-04-24 DIAGNOSIS — R05 Cough: Secondary | ICD-10-CM | POA: Diagnosis not present

## 2016-04-24 DIAGNOSIS — Z09 Encounter for follow-up examination after completed treatment for conditions other than malignant neoplasm: Secondary | ICD-10-CM

## 2016-04-24 MED ORDER — MOXIFLOXACIN HCL 400 MG PO TABS: 400.0000 mg | ORAL_TABLET | Freq: Every day | ORAL | Status: DC

## 2016-04-24 NOTE — Progress Notes (Signed)
Subjective:    Patient ID: Dakota [name], male    DOB: 01/04/1933, 82 y.o.   MRN: [id number]  DOS:  04/24/2016 Type of visit - description : Follow-up previous visit Interval history:  Cough: Since the last time he was here, chest x-ray and CT sinuses were negative. he continue with cough, postnasal dripping and wheezing. Good compliance with medication. Also, on Synthroid, due for labs.  Review of Systems  Denies fever chills No chest pain or difficulty breathing Past Medical History  Diagnosis Date  . Chronic neck pain     and HA, dx w/ cervical stenosis  . GERD (gastroesophageal reflux disease)   . COPD (chronic obstructive pulmonary disease) (HCC)   . Hyperlipidemia   . Peptic ulcer disease   . BPH (benign prostatic hypertrophy)   . Depression   . CAD (coronary artery disease)     per cath: mild/non obstructive 03/2006  . Osteopenia   . 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 Pulmonary nodule     s/p Ct x 2 last 05/2008: scarring  . Gallbladder & bile duct stone     per US 2007  . Diverticulosis of colon (without mention of hemorrhage)   . Hyperplastic colon polyp   . Hypertension   . Thyroid disease   . Lumbar spondylosis     Facet mediated pain, Dr. Ethelene Halamos    Past Surgical History  Procedure Laterality Date  . Arthoscopic rotaor cuff repair      L-2003, R-2004 Dr Ranell PatrickNorris  . Inguinal hernia repair      r  . Ulcer surgery  1978  . Spinal cord stimulator insertion  5/11    lower back  . Facet joint injection  2016    Bilateral L4-5 and L5-S1, Dr. Ethelene Halamos    Social History   Social History  . Marital Status: Married    Spouse Name: N/A  . Number of Children: 2  . Years of Education: N/A   Occupational History  . 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 Smoking status: Former Games developermoker  . Smokeless tobacco: Never Used     Comment: 2011 aprox  . Alcohol Use: 1.2 oz/week      0 Standard drinks or equivalent, 2 Shots of liquor per week     Comment: 2 mixed drinks a week  . Drug Use: No  . Sexual Activity: No   Other Topics Concern  . Not on file   Social History Narrative   Lives w/ wife   daugther is medical PA              Medication List       This list is accurate as of: 04/24/16 11:59 PM.  Always use your most recent med list.               Aclidinium Bromide 400 MCG/ACT Aepb  Inhale 1 puff into the lungs 2 (two) times daily.     albuterol 108 (90 Base) MCG/ACT inhaler  Commonly known as:  PROAIR HFA  Inhale 2 puffs into the lungs every 6 (six) hours as needed for wheezing or shortness of breath.     atorvastatin 20 MG tablet  Commonly known as:  LIPITOR  Take 1 tablet (20 mg total) by mouth at bedtime.  azelastine 0.1 % nasal spray  Commonly known as:  ASTELIN  Place 2 sprays into both nostrils at bedtime as needed for rhinitis. Use in each nostril as directed     CYCLOSET 0.8 MG Tabs  Generic drug:  Bromocriptine Mesylate  Take 0.8 mg by mouth daily.     HYDROmorphone 8 MG tablet  Commonly known as:  DILAUDID     levothyroxine 100 MCG tablet  Commonly known as:  SYNTHROID, LEVOTHROID  Take by mouth daily before breakfast. 1 tablet every day except Tuesday and Thursday take only half tablet     mometasone-formoterol 100-5 MCG/ACT Aero  Commonly known as:  DULERA  Inhale 2 puffs into the lungs 2 (two) times daily.     moxifloxacin 400 MG tablet  Commonly known as:  AVELOX  Take 1 tablet (400 mg total) by mouth daily.     NEXIUM PO  Take by mouth. OTC , takes one every AM     venlafaxine XR 75 MG 24 hr capsule  Commonly known as:  EFFEXOR-XR     zolpidem 5 MG tablet  Commonly known as:  AMBIEN  Take 1 tablet (5 mg total) by mouth at bedtime as needed for sleep.           Objective:   Physical Exam BP 138/82 mmHg  Pulse 70  Temp(Src) 98.1 F (36.7 C) (Oral)  Ht  (1.6 m)  Wt 140 lb 8 oz (63.73 kg)   BMI 24.89 kg/m2  SpO2 96% General:   Well developed, well nourished . NAD.  HEENT:  Normocephalic . Face symmetric, atraumatic Lungs:  Few rhonchi and wheezes bilaterally, crackles left base? Normal respiratory effort, no intercostal retractions, no accessory muscle use. Heart: RRR,  no murmur.  No pretibial edema bilaterally  Skin: Not pale. Not jaundice Neurologic:  alert & oriented X3.  Speech normal, gait appropriate for age and unassisted Psych--  Cognition and judgment appear intact.  Cooperative with normal attention span and concentration.  Behavior appropriate. No anxious or depressed appearing.      Assessment & Plan:   Assessment: Prediabetes Hyperlipidemia Hypothyroidism Hypogonadism-- not on HRT Depression, insomnia (effexor, ambien; xanax dc 12-2015,not using them) COPD GI:   ---GERD, PUD, diverticulosis, colon polyps ---Elevated LFTs Osteopenia CV: Aneurysm, left ICA 2 mm 2009, saw neurology Mild non-obstructive CAD per cath 2007 MSK: ---Pain med rx by Dr Ethelene Hal --Lumbar spondylosis --Spinal cord stimulation 2011 --Chronic neck pain and headache, DX cervical stenosis H/o Weight loss, fatigue, sweats: On and off symptoms since at least 2013, see previous entries  Palpable aorta, ultrasound (-) H/o vit d def ?PMR 2013   PLAN: Cough: Since the last time she was here, chest x-ray and CT showed no pneumonia or major sinus infection. Symptoms continue about the same, with question of crackles at L base. S/p 1 round of antibiotics 02-2016. Plan: See pulmonary as scheduled next month, will try to get a sooner appointment, Avelox for one week, continue other medications COPD: As above Hypothyroidism: Check a TSH RTC 3 months

## 2016-04-24 NOTE — Progress Notes (Signed)
Pre visit review using our clinic review tool, if applicable. No additional management support is needed unless otherwise documented below in the visit note. 

## 2016-04-24 NOTE — Patient Instructions (Signed)
GO TO THE LAB : Get the blood work     GO TO THE FRONT DESK Schedule your next appointment for a  Check up in 3 months  

## 2016-04-25 ENCOUNTER — Telehealth: Payer: Self-pay | Admitting: *Deleted

## 2016-04-25 ENCOUNTER — Telehealth: Payer: Self-pay | Admitting: Internal Medicine

## 2016-04-25 LAB — TSH: TSH: 0.44 u[IU]/mL (ref 0.35–4.50)

## 2016-04-25 NOTE — Telephone Encounter (Signed)
Yes. Paperwork forwarded to [facility], CMA.

## 2016-04-25 NOTE — Telephone Encounter (Signed)
error 

## 2016-04-25 NOTE — Assessment & Plan Note (Signed)
Cough: Since the last time she was here, chest x-ray and CT showed no pneumonia or major sinus infection. Symptoms continue about the same, with question of crackles at L base. S/p 1 round of antibiotics 02-2016. Plan: See pulmonary as scheduled next month, will try to get a sooner appointment, Avelox for one week, continue other medications COPD: As above Hypothyroidism: Check a TSH RTC 3 months

## 2016-04-25 NOTE — Telephone Encounter (Signed)
PA initiated on [email or web address], awaiting determination. JG//CMA 

## 2016-04-25 NOTE — Telephone Encounter (Signed)
Caller name: [employer]  Can be reached: [phone]    Reason for call: moxifloxacin (AVELOX) 400 MG tablet [[id number]][[id number]] denied by insurance. Reason for denial is being faxed over with alternative medications

## 2016-04-25 NOTE — Telephone Encounter (Signed)
Ashlee, did you see this come off the fax machine?

## 2016-04-25 NOTE — Telephone Encounter (Signed)
[place] from [employer] is requesting call back, [phone]

## 2016-04-26 NOTE — Telephone Encounter (Signed)
Tylene FantasiaAshlee S Langston, RN at 04/25/2016 5:14 PM     Status: Signed       Expand All Collapse All   Yes. Paperwork forwarded to [place]lyn, CMA.             Conrad BurlingtonKaylyn Acie Custis, CMA at 04/25/2016 5:10 PM     Status: Signed       Expand All Collapse All   Ashlee, did you see this come off the fax machine?            Audelia Actonenise L Smith at 04/25/2016 2:59 PM     Status: Signed       Expand All Collapse All   Caller name: [employer]  Can be reached: [phone]    Reason for call: moxifloxacin (AVELOX) 400 MG tablet [[id number]][[id number]] denied by insurance. Reason for denial is being faxed over with alternative medications

## 2016-04-26 NOTE — Telephone Encounter (Signed)
LMOM informing Pt to call if he had any trouble picking up the prescription for Avelox.

## 2016-04-26 NOTE — Telephone Encounter (Signed)
Received, see telephone note PA: Moxifloxacin HCl dated 04/25/2016 for further information.

## 2016-04-26 NOTE — Telephone Encounter (Signed)
Received faxed denial. Avelox being denied because the FDA label for medication use is not indicated for treatment of cough/COPD. No list of alternatives have been given. Appeal form given, forwarded to Dr. Drue NovelPaz for further advice.

## 2016-04-26 NOTE — Telephone Encounter (Signed)
I spoke with the pharmacist yesterday, he said the patient would pay out of pocket. Please call the patient and confirmed he got  the medication.

## 2016-05-02 ENCOUNTER — Other Ambulatory Visit: Payer: Self-pay | Admitting: Internal Medicine

## 2016-06-05 ENCOUNTER — Institutional Professional Consult (permissible substitution): Payer: Medicare Other | Admitting: Internal Medicine

## 2016-06-06 ENCOUNTER — Other Ambulatory Visit: Payer: Self-pay | Admitting: Internal Medicine

## 2016-07-06 ENCOUNTER — Other Ambulatory Visit: Payer: Self-pay | Admitting: Internal Medicine

## 2016-07-25 ENCOUNTER — Ambulatory Visit (INDEPENDENT_AMBULATORY_CARE_PROVIDER_SITE_OTHER): Payer: Medicare Other | Admitting: Internal Medicine

## 2016-07-25 ENCOUNTER — Encounter: Payer: Self-pay | Admitting: Internal Medicine

## 2016-07-25 VITALS — BP 144/82 | HR 73 | Temp 97.7°F | Ht 63.0 in | Wt 142.1 lb

## 2016-07-25 DIAGNOSIS — Z23 Encounter for immunization: Secondary | ICD-10-CM

## 2016-07-25 DIAGNOSIS — K1370 Unspecified lesions of oral mucosa: Secondary | ICD-10-CM

## 2016-07-25 DIAGNOSIS — E291 Testicular hypofunction: Secondary | ICD-10-CM

## 2016-07-25 DIAGNOSIS — J449 Chronic obstructive pulmonary disease, unspecified: Secondary | ICD-10-CM | POA: Diagnosis not present

## 2016-07-25 MED ORDER — ALBUTEROL SULFATE HFA 108 (90 BASE) MCG/ACT IN AERS: 2.0000 | INHALATION_SPRAY | Freq: Four times a day (QID) | RESPIRATORY_TRACT | 6 refills | Status: DC | PRN

## 2016-07-25 MED ORDER — MOMETASONE FURO-FORMOTEROL FUM 100-5 MCG/ACT IN AERO: 2.0000 | INHALATION_SPRAY | Freq: Two times a day (BID) | RESPIRATORY_TRACT | 1 refills | Status: DC

## 2016-07-25 MED ORDER — AZELASTINE HCL 0.1 % NA SOLN: 2.0000 | Freq: Every evening | NASAL | 6 refills | Status: DC | PRN

## 2016-07-25 MED ORDER — CYCLOSET 0.8 MG PO TABS: 0.8000 mg | ORAL_TABLET | Freq: Every day | ORAL | 6 refills | Status: DC

## 2016-07-25 MED ORDER — MOMETASONE FURO-FORMOTEROL FUM 100-5 MCG/ACT IN AERO
2.0000 | INHALATION_SPRAY | Freq: Two times a day (BID) | RESPIRATORY_TRACT | 6 refills | Status: DC
Start: 2016-07-25 — End: 2016-07-25

## 2016-07-25 NOTE — Progress Notes (Signed)
Subjective:    Patient ID: Dakota [name], male    DOB: 06-06-1933, 82 y.o.   MRN: [id number]  DOS:  07/25/2016 Type of visit - description : Routine visit Interval history:  Respiratory symptoms definitely improved, currently has cough in the mornings with very little sputum. Medication list reviewed and adjusted. Labs reviewed: Due for a testosterone level. Developed a growth a month ago, corner of the mouth. Concerned.  Review of Systems  at this point denies depression or major problems with anxiety. No fever chills  Past Medical History:  Diagnosis Date  . 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 BPH (benign prostatic hypertrophy)   . CAD (coronary artery disease)    per cath: mild/non obstructive 03/2006  . Chronic neck pain    and HA, dx w/ cervical stenosis  . COPD (chronic obstructive pulmonary disease) (HCC)   . Depression   . Diverticulosis of colon (without mention of hemorrhage)   . Gallbladder & bile duct stone    per [place] 2007  . GERD (gastroesophageal reflux disease)   . Hyperlipidemia   . Hyperplastic colon polyp   . Hypertension   . Lumbar spondylosis    Facet mediated pain, Dr. Ethelene Hal  . Osteopenia   . Peptic ulcer disease   . Pulmonary nodule    s/p Ct x 2 last 05/2008: scarring  . Thyroid disease     Past Surgical History:  Procedure Laterality Date  . ARTHOSCOPIC ROTAOR CUFF REPAIR     L-2003, R-2004 Dr Ranell Patrick  . FACET JOINT INJECTION  2016   Bilateral L4-5 and L5-S1, Dr. Ethelene Hal  . INGUINAL HERNIA REPAIR     r  . SPINAL CORD STIMULATOR INSERTION  5/11   lower back  . ulcer surgery  1978    Social History   Social History  . Marital status: Married    Spouse name: N/A  . Number of children: 2  . Years of education: N/A   Occupational History  . 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 Smoking status: Former Games developer  . Smokeless tobacco: Never Used    Comment: 2011 aprox  . Alcohol use 1.2 oz/week    2 Shots of liquor per week     Comment: 2 mixed drinks a week  . Drug use: No  . Sexual activity: No   Other Topics Concern  . Not on file   Social History Narrative   Lives w/ wife   daugther is medical PA              Medication List       Accurate as of 07/25/16 11:59 PM. Always use your most recent med list.          albuterol 108 (90 Base) MCG/ACT inhaler Commonly known as:  PROAIR HFA Inhale 2 puffs into the lungs every 6 (six) hours as needed for wheezing or shortness of breath.   atorvastatin 20 MG tablet Commonly known as:  LIPITOR Take 1 tablet (20 mg total) by mouth at bedtime.   azelastine 0.1 % nasal spray Commonly known as:  ASTELIN Place 2 sprays into both nostrils at bedtime as needed for rhinitis. Use in each nostril as directed   CYCLOSET 0.8 MG Tabs Generic drug:  Bromocriptine Mesylate Take 1 tablet (0.8 mg total) by mouth  daily.   HYDROmorphone 8 MG tablet Commonly known as:  DILAUDID   levothyroxine 75 MCG tablet Commonly known as:  SYNTHROID, LEVOTHROID Take 1 tablet (75 mcg total) by mouth daily before breakfast.   mometasone-formoterol 100-5 MCG/ACT Aero Commonly known as:  DULERA Inhale 2 puffs into the lungs 2 (two) times daily.   NEXIUM PO Take by mouth. OTC , takes one every AM   tiotropium 18 MCG inhalation capsule Commonly known as:  SPIRIVA HANDIHALER Place 1 capsule (18 mcg total) into inhaler and inhale daily.   venlafaxine XR 75 MG 24 hr capsule Commonly known as:  EFFEXOR-XR   zolpidem 5 MG tablet Commonly known as:  AMBIEN Take 1 tablet (5 mg total) by mouth at bedtime as needed for sleep.          Objective:   Physical Exam  HENT:  Head:     BP (!) 144/82 (BP Location: Right Arm, Patient Position: Sitting, Cuff Size: Normal)   Pulse 73   Temp 97.7 F (36.5 C) (Oral)   Ht 5\' 3"  (1.6 m)   Wt 142 lb 2 oz (64.5 kg)   SpO2 97%   BMI 25.18 kg/m    General:   Well developed, well nourished . NAD.  HEENT:  Normocephalic . Face symmetric, atraumatic Lungs:  Slightly decreased breath sounds otherwise clear Normal respiratory effort, no intercostal retractions, no accessory muscle use. Heart: RRR,  no murmur.  No pretibial edema bilaterally  Skin: Not pale. Not jaundice Neurologic:  alert & oriented X3.  Speech normal, gait appropriate for age and unassisted Psych--  Cognition and judgment appear intact.  Cooperative with normal attention span and concentration.  Behavior appropriate. No anxious or depressed appearing.      Assessment & Plan:    Assessment: Prediabetes Hyperlipidemia Hypothyroidism Hypogonadism-- not on HRT, on bromocriptine  Depression, insomnia (effexor, ambien; xanax dc 12-2015,not using them) COPD: As of 07-2016, previous pulmonary referral and PFTs failed. GI:   ---GERD, PUD, diverticulosis, colon polyps ---Elevated LFTs Osteopenia CV: Aneurysm, left ICA 2 mm 2009, saw neurology Mild non-obstructive CAD per cath 2007 MSK: ---Pain med rx by Dr Ethelene Halamos --Lumbar spondylosis --Spinal cord stimulation 2011 --Chronic neck pain and headache, DX cervical stenosis H/o Weight loss, fatigue, sweats: On and off symptoms since at least 2013, see previous entries  Palpable aorta, ultrasound (-) H/o vit d def ?PMR 2013   PLAN: COPD, cough: Improved, currently taking Dulera, Spiriva and occasional albuterol. Not using to [place] . prevnar today. Hypogonadism: On bromocriptine, check levels. Lesion @ corner of the mouth: Refer to ENT, excision? RTC 4 months, CPX

## 2016-07-25 NOTE — Progress Notes (Signed)
Pre visit review using our clinic review tool, if applicable. No additional management support is needed unless otherwise documented below in the visit note. 

## 2016-07-25 NOTE — Patient Instructions (Signed)
GO TO THE LAB : Get the blood work     GO TO THE FRONT DESK Schedule your next appointment for a physical exam in 4 months 

## 2016-07-26 ENCOUNTER — Other Ambulatory Visit: Payer: Self-pay | Admitting: Internal Medicine

## 2016-07-26 NOTE — Assessment & Plan Note (Signed)
COPD, cough: Improved, currently taking Dulera, Spiriva and occasional albuterol. Not using to [place] . prevnar today. Hypogonadism: On bromocriptine, check levels. Lesion @ corner of the mouth: Refer to ENT, excision? RTC 4 months, CPX

## 2016-07-27 LAB — TESTOS,TOTAL,FREE AND SHBG (FEMALE)
SEX HORMONE BINDING GLOB.: 58 nmol/L (ref 22–77)
TESTOSTERONE,FREE: 24.3 pg/mL — AB (ref 30.0–135.0)
Testosterone,Total,LC/MS/MS: 256 ng/dL (ref 250–1100)

## 2016-08-10 ENCOUNTER — Other Ambulatory Visit: Payer: Self-pay | Admitting: Internal Medicine

## 2016-08-31 ENCOUNTER — Other Ambulatory Visit: Payer: Self-pay | Admitting: Neurological Surgery

## 2016-08-31 DIAGNOSIS — M542 Cervicalgia: Secondary | ICD-10-CM

## 2016-09-01 ENCOUNTER — Ambulatory Visit
Admission: RE | Admit: 2016-09-01 | Discharge: 2016-09-01 | Disposition: A | Discharge status: Home / Self Care | Payer: Medicare Other | Source: Ambulatory Visit | Attending: Neurological Surgery | Admitting: Neurological Surgery

## 2016-09-01 DIAGNOSIS — M542 Cervicalgia: Secondary | ICD-10-CM

## 2016-10-14 ENCOUNTER — Telehealth: Payer: Self-pay | Admitting: Internal Medicine

## 2016-10-19 NOTE — Telephone Encounter (Signed)
Okay #30, 3 refills 

## 2016-10-19 NOTE — Telephone Encounter (Signed)
Please advise 

## 2016-10-19 NOTE — Telephone Encounter (Signed)
[id number] [place] [address] [facility] [address] [facility] [address] [phone] (Phone) [phone] (Fax)     Reason for call: States they need a new Rx for zolpidem (AMBIEN) 5 MG tablet [[id number] Because the Rx written in Feb 2017 expired after 6 months

## 2016-10-20 MED ORDER — ZOLPIDEM TARTRATE 5 MG PO TABS: 5.0000 mg | ORAL_TABLET | Freq: Every evening | ORAL | 3 refills | Status: DC | PRN

## 2016-10-20 NOTE — Addendum Note (Signed)
Addended byConrad [place]: Jakub Debold D on: 10/20/2016 07:29 AM   Modules accepted: Orders

## 2016-10-20 NOTE — Telephone Encounter (Signed)
Rx printed, awaiting MD signature.  

## 2016-10-20 NOTE — Telephone Encounter (Signed)
Rx faxed to [facility] pharmacy.  

## 2016-11-08 ENCOUNTER — Encounter: Payer: Self-pay | Admitting: Gastroenterology

## 2016-11-20 ENCOUNTER — Telehealth: Payer: Self-pay

## 2016-11-20 NOTE — Telephone Encounter (Signed)
PA initiated via Covermymeds; KEY: [id number]. Awaiting MD signature.

## 2016-11-21 NOTE — Telephone Encounter (Signed)
[facility] informed [facility].

## 2016-11-21 NOTE — Telephone Encounter (Signed)
[facility] Pharmacy not open yet. Will call again later.

## 2016-11-21 NOTE — Telephone Encounter (Signed)
Received PA approval through Covermymeds. Effective from 11/20/2016 through 11/20/2017.

## 2016-11-27 NOTE — Telephone Encounter (Signed)
Received PA approval notification letter. Ambien approved through 11/20/2017. Letter sent for scanning.

## 2016-12-08 ENCOUNTER — Other Ambulatory Visit: Payer: Self-pay | Admitting: Internal Medicine

## 2016-12-21 ENCOUNTER — Other Ambulatory Visit: Payer: Self-pay | Admitting: Internal Medicine

## 2016-12-27 ENCOUNTER — Other Ambulatory Visit: Payer: Self-pay | Admitting: Internal Medicine

## 2017-01-31 ENCOUNTER — Other Ambulatory Visit: Payer: Self-pay | Admitting: Internal Medicine

## 2017-02-16 ENCOUNTER — Telehealth: Payer: Self-pay | Admitting: Internal Medicine

## 2017-02-16 ENCOUNTER — Ambulatory Visit: Payer: Medicare Other | Admitting: Internal Medicine

## 2017-02-16 NOTE — Telephone Encounter (Signed)
no

## 2017-02-16 NOTE — Telephone Encounter (Signed)
Pt called in at 8:40 to reschedule his appt. Pt rescheduled for Monday at 11:00 instead   Should pt be charged?

## 2017-02-19 ENCOUNTER — Ambulatory Visit (INDEPENDENT_AMBULATORY_CARE_PROVIDER_SITE_OTHER): Payer: Medicare Other | Admitting: Internal Medicine

## 2017-02-19 ENCOUNTER — Other Ambulatory Visit: Payer: Self-pay | Admitting: Internal Medicine

## 2017-02-19 ENCOUNTER — Encounter: Payer: Self-pay | Admitting: Internal Medicine

## 2017-02-19 VITALS — BP 138/78 | HR 83 | Temp 97.7°F | Resp 14 | Ht 63.0 in | Wt 138.5 lb

## 2017-02-19 DIAGNOSIS — F32A Depression, unspecified: Secondary | ICD-10-CM

## 2017-02-19 DIAGNOSIS — F329 Major depressive disorder, single episode, unspecified: Secondary | ICD-10-CM | POA: Diagnosis not present

## 2017-02-19 DIAGNOSIS — E785 Hyperlipidemia, unspecified: Secondary | ICD-10-CM

## 2017-02-19 DIAGNOSIS — I10 Essential (primary) hypertension: Secondary | ICD-10-CM

## 2017-02-19 DIAGNOSIS — Z09 Encounter for follow-up examination after completed treatment for conditions other than malignant neoplasm: Secondary | ICD-10-CM

## 2017-02-19 DIAGNOSIS — R5382 Chronic fatigue, unspecified: Secondary | ICD-10-CM | POA: Diagnosis not present

## 2017-02-19 DIAGNOSIS — E034 Atrophy of thyroid (acquired): Secondary | ICD-10-CM | POA: Diagnosis not present

## 2017-02-19 DIAGNOSIS — R739 Hyperglycemia, unspecified: Secondary | ICD-10-CM | POA: Diagnosis not present

## 2017-02-19 LAB — CBC WITH DIFFERENTIAL/PLATELET
BASOS ABS: 0.1 10*3/uL (ref 0.0–0.1)
Basophils Relative: 0.7 % (ref 0.0–3.0)
Eosinophils Absolute: 0.2 10*3/uL (ref 0.0–0.7)
Eosinophils Relative: 1.4 % (ref 0.0–5.0)
HCT: 42.3 % (ref 39.0–52.0)
Hemoglobin: 14 g/dL (ref 13.0–17.0)
LYMPHS ABS: 1.7 10*3/uL (ref 0.7–4.0)
LYMPHS PCT: 12.2 % (ref 12.0–46.0)
MCHC: 33.1 g/dL (ref 30.0–36.0)
MCV: 96.9 fl (ref 78.0–100.0)
MONOS PCT: 8.5 % (ref 3.0–12.0)
Monocytes Absolute: 1.2 10*3/uL — ABNORMAL HIGH (ref 0.1–1.0)
NEUTROS ABS: 10.8 10*3/uL — AB (ref 1.4–7.7)
NEUTROS PCT: 77.2 % — AB (ref 43.0–77.0)
PLATELETS: 390 10*3/uL (ref 150.0–400.0)
RBC: 4.37 Mil/uL (ref 4.22–5.81)
RDW: 14 % (ref 11.5–15.5)
WBC: 14 10*3/uL — ABNORMAL HIGH (ref 4.0–10.5)

## 2017-02-19 LAB — VITAMIN B12: VITAMIN B 12: 313 pg/mL (ref 211–911)

## 2017-02-19 LAB — BASIC METABOLIC PANEL
BUN: 15 mg/dL (ref 6–23)
CHLORIDE: 104 meq/L (ref 96–112)
CO2: 27 mEq/L (ref 19–32)
CREATININE: 0.96 mg/dL (ref 0.40–1.50)
Calcium: 9.5 mg/dL (ref 8.4–10.5)
GFR: 79.78 mL/min (ref >60.00)
Glucose, Bld: 110 mg/dL — ABNORMAL HIGH (ref 70–99)
POTASSIUM: 4.5 meq/L (ref 3.5–5.1)
Sodium: 139 mEq/L (ref 135–145)

## 2017-02-19 LAB — HEMOGLOBIN A1C: Hgb A1c MFr Bld: 6 % (ref 4.6–6.5)

## 2017-02-19 LAB — FOLATE: FOLATE: 13.7 ng/mL (ref >5.9)

## 2017-02-19 LAB — TSH: TSH: 3.05 u[IU]/mL (ref 0.35–4.50)

## 2017-02-19 LAB — ALT: ALT: 22 U/L (ref 0–53)

## 2017-02-19 LAB — AST: AST: 23 U/L (ref 0–37)

## 2017-02-19 MED ORDER — ZOLPIDEM TARTRATE 5 MG PO TABS: 5.0000 mg | ORAL_TABLET | Freq: Every evening | ORAL | 3 refills | Status: DC | PRN

## 2017-02-19 MED ORDER — VENLAFAXINE HCL ER 75 MG PO CP24: 75.0000 mg | ORAL_CAPSULE | Freq: Two times a day (BID) | ORAL | 3 refills | Status: DC

## 2017-02-19 NOTE — Progress Notes (Signed)
Subjective:    Patient ID: Dakota [name], male    DOB: 02/14/1933, 83 y.o.   MRN: [id number]  DOS:  02/19/2017 Type of visit - description : rov Interval history: COPD: Good compliance with medications, still coughing daily, yellow sputum. Admits  DOE to be worse for the last couple of months Reports he has been very depressed, states his wife retired a year ago, "she won't do nothing, I have to do all the work at home and grocery shopping, he won't take care of herself and refused to go to the doctor" He has a chronic problem with fatigue, worse for the last month.   Wt Readings from Last 3 Encounters:  02/19/17 138 lb 8 oz (62.8 kg)  07/25/16 142 lb 2 oz (64.5 kg)  04/24/16 140 lb 8 oz (63.7 kg)    Review of Systems Denies chest pain, lower extremity edema or palpitations. No orthopnea per se, uses 2 pillows. Sometimes he feels lightheaded when he stands up. No nausea, vomiting, diarrhea. No blood in the stools No hemoptysis No suicidal ideas Denies fever chills, he has chronic headaches, felt to be cervicogenic. Weight is a slightly decreased today but at home it  fluctuates.  Past Medical History:  Diagnosis Date  . 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 BPH (benign prostatic hypertrophy)   . CAD (coronary artery disease)    per cath: mild/non obstructive 03/2006  . Chronic neck pain    and HA, dx w/ cervical stenosis  . COPD (chronic obstructive pulmonary disease) (HCC)   . Depression   . Diverticulosis of colon (without mention of hemorrhage)   . Gallbladder & bile duct stone    per US 2007  . GERD (gastroesophageal reflux disease)   . Hyperlipidemia   . Hyperplastic colon polyp   . Hypertension   . Lumbar spondylosis    Facet mediated pain, Dr. Ethelene Halamos  . Osteopenia   . Peptic ulcer disease   . Pulmonary nodule    s/p Ct x 2 last 05/2008: scarring  . Thyroid disease     Past Surgical History:  Procedure Laterality Date  . ARTHOSCOPIC  ROTAOR CUFF REPAIR     L-2003, R-2004 Dr Ranell PatrickNorris  . FACET JOINT INJECTION  2016   Bilateral L4-5 and L5-S1, Dr. Ethelene Halamos  . INGUINAL HERNIA REPAIR     r  . SPINAL CORD STIMULATOR INSERTION  5/11   lower back  . ulcer surgery  1978    Social History   Social History  . Marital status: Married    Spouse name: N/A  . Number of children: 2  . Years of education: N/A   Occupational History  . part time     school crossing guard   Social History Main Topics  . Smoking status: Former Games developermoker  . Smokeless tobacco: Never Used     Comment: 2011 aprox  . Alcohol use 1.2 oz/week    2 Shots of liquor per week     Comment: 2 mixed drinks a week  . Drug use: No  . Sexual activity: No   Other Topics Concern  . Not on file   Social History Narrative   Lives w/ wife, she retired ~02-2016   Daugther is medical PA            Allergies as of 02/19/2017      Reactions   Sulfonamide Derivatives Rash  Medication List       Accurate as of 02/19/17 11:59 PM. Always use your most recent med list.          albuterol 108 (90 Base) MCG/ACT inhaler Commonly known as:  PROAIR HFA Inhale 2 puffs into the lungs every 6 (six) hours as needed for wheezing or shortness of breath.   atorvastatin 20 MG tablet Commonly known as:  LIPITOR Take 1 tablet (20 mg total) by mouth at bedtime.   azelastine 0.1 % nasal spray Commonly known as:  ASTELIN Place 2 sprays into both nostrils at bedtime as needed for rhinitis. Use in each nostril as directed   CYCLOSET 0.8 MG Tabs Generic drug:  Bromocriptine Mesylate Take 1 tablet (0.8 mg total) by mouth daily.   HYDROmorphone 8 MG tablet Commonly known as:  DILAUDID   levothyroxine 75 MCG tablet Commonly known as:  SYNTHROID, LEVOTHROID Take 1 tablet (75 mcg total) by mouth daily before breakfast.   mometasone-formoterol 100-5 MCG/ACT Aero Commonly known as:  DULERA Inhale 2 puffs into the lungs 2 (two) times daily.   NEXIUM PO Take by  mouth. OTC , takes one every AM   tiotropium 18 MCG inhalation capsule Commonly known as:  SPIRIVA HANDIHALER Place 1 capsule (18 mcg total) into inhaler and inhale daily.   venlafaxine XR 75 MG 24 hr capsule Commonly known as:  EFFEXOR-XR Take 1 capsule (75 mg total) by mouth 2 (two) times daily.   zolpidem 5 MG tablet Commonly known as:  AMBIEN Take 1 tablet (5 mg total) by mouth at bedtime as needed for sleep.          Objective:   Physical Exam BP 138/78 (BP Location: Right Arm, Patient Position: Sitting, Cuff Size: Small)   Pulse 83   Temp 97.7 F (36.5 C) (Oral)   Resp 14   Ht 5\' 3"  (1.6 m)   Wt 138 lb 8 oz (62.8 kg)   SpO2 92%   BMI 24.53 kg/m      Assessment & Plan:   Assessment: Prediabetes Hyperlipidemia Hypothyroidism Hypogonadism-- not on HRT, on bromocriptine  Depression, insomnia (effexor, ambien; xanax dc 12-2015,not using them) COPD: As of 07-2016, previous pulmonary referral and PFTs failed. GI:   ---GERD, PUD, diverticulosis, colon polyps ---Elevated LFTs Osteopenia CV: Aneurysm, left ICA 2 mm 2009, saw neurology Mild non-obstructive CAD per cath 2007 MSK: ---Pain med rx by Dr Ethelene Hal --Lumbar spondylosis --Spinal cord stimulation 2011 --Chronic neck pain and headache, DX cervical stenosis H/o Weight loss, fatigue, sweats: On and off symptoms since at least 2013, see previous entries  Palpable aorta, ultrasound (-) H/o vit d def ?PMR 2013  PLAN DM : Check A1c Hyperlipidemia: On Lipitor, check LFTs Hypothyroidism:on Synthroid, check a TSH  Low testosterone: Has not seen endo in a while, used to be on bromocriptine COPD: Continue with daily cough, DOE slightly worse, on Spiriva, LABA,  inhalers steroids, previous referrals to pulmonary failed. Plan: My RN reviewed w/ pt  the inhalers use technique. Reassess on RTC. Fatigue: Chronic, on and off problem. Check a CBC, B12, folic acid Depression: See above comments. Suspect depression is  driving his symptoms lately, he is counseled today; his daughter is a PA, works  @ psychiatry office, she is trying to help >> a personal visit likely would help them. Refill Ambien, we also agreed to  increase Effexor 75 to 1 po bid . Mouth lesion: See previous visit, it cleared on its own.  OTC 6 weeks

## 2017-02-19 NOTE — Progress Notes (Signed)
Pre visit review using our clinic review tool, if applicable. No additional management support is needed unless otherwise documented below in the visit note. 

## 2017-02-19 NOTE — Patient Instructions (Addendum)
GO TO THE LAB : Get the blood work     GO TO THE FRONT DESK Schedule your next appointment for a  checkup in 6 weeks    Increase effexor to one capsule  Twice a day

## 2017-02-20 NOTE — Assessment & Plan Note (Signed)
DM : Check A1c Hyperlipidemia: On Lipitor, check LFTs Hypothyroidism:on Synthroid, check a TSH  Low testosterone: Has not seen endo in a while, used to be on bromocriptine COPD: Continue with daily cough, DOE slightly worse, on Spiriva, LABA,  inhalers steroids, previous referrals to pulmonary failed. Plan: My RN reviewed w/ pt  the inhalers use technique. Reassess on RTC. Fatigue: Chronic, on and off problem. Check a CBC, B12, folic acid Depression: See above comments. Suspect depression is driving his symptoms lately, he is counseled today; his daughter is a PA, works  @ psychiatry office, she is trying to help >> a personal visit likely would help them. Refill Ambien, we also agreed to  increase Effexor 75 to 1 po bid . Mouth lesion: See previous visit, it cleared on its own.  OTC 6 weeks

## 2017-02-21 ENCOUNTER — Telehealth: Payer: Self-pay | Admitting: Internal Medicine

## 2017-02-21 NOTE — Telephone Encounter (Signed)
Patient called stating he did not get a Rx in his hand on 3/5. Request call back.

## 2017-02-21 NOTE — Telephone Encounter (Signed)
Caller name: Relationship to patient: Self Can be reached: [phone]  Pharmacy:  [facility] [id number] - [place], [place] - [address] [facility] [address] [facility] [address] [phone] (Phone) [phone] (Fax)     Reason for call: Refill on levothyroxine (SYNTHROID, LEVOTHROID) 75 MCG tablet [[id number]][[id number]]  ALSO: Pharmacy did not receive Rx for zolpidem (AMBIEN) 5 MG tablet

## 2017-02-21 NOTE — Telephone Encounter (Signed)
Levothyroxine was refilled to [facility] on 12/12/2016 #30 and 5 RF. Ambien prescription was given to Pt at appt on 02/19/2017 #30 and 3RF, he will just need to take Rx to pharmacy to be filled.

## 2017-02-22 MED ORDER — ZOLPIDEM TARTRATE 5 MG PO TABS: 5.0000 mg | ORAL_TABLET | Freq: Every evening | ORAL | 3 refills | Status: DC | PRN

## 2017-02-22 NOTE — Telephone Encounter (Signed)
Ambien Rx was given to Pt at time of OV on 02/19/17 (I personally checked my faxed controlled substance folder in my desk and did not have it). Pt states he does not have the hard copy to take to the pharmacy. Is it okay to re-print prescription and fax to pharmacy for Pt?

## 2017-02-22 NOTE — Telephone Encounter (Signed)
yes

## 2017-02-22 NOTE — Telephone Encounter (Signed)
Rx faxed to [facility] pharmacy.  

## 2017-02-22 NOTE — Telephone Encounter (Signed)
Rx reprinted, awaiting MD signature.  

## 2017-03-11 ENCOUNTER — Other Ambulatory Visit: Payer: Self-pay | Admitting: Internal Medicine

## 2017-03-23 ENCOUNTER — Other Ambulatory Visit: Payer: Self-pay | Admitting: Internal Medicine

## 2017-04-11 ENCOUNTER — Ambulatory Visit: Payer: Medicare Other | Admitting: Internal Medicine

## 2017-06-15 ENCOUNTER — Other Ambulatory Visit: Payer: Self-pay | Admitting: Internal Medicine

## 2017-06-15 NOTE — Telephone Encounter (Signed)
Rx faxed to [facility] pharmacy.  

## 2017-06-15 NOTE — Telephone Encounter (Signed)
Rx printed, awaiting MD signature.  

## 2017-06-15 NOTE — Telephone Encounter (Signed)
Pt is requesting refill on Ambien 5mg .  Last OV: 02/19/2017 Last Fill: 02/22/2017 #30 and 3RF UDS: Not needed for Ambien per PCP  [place] Controlled Substance Database printed (Hydromorphone 8mg  tabs prescribed regularly by Dr. Ethelene Halamos).   Please advise.

## 2017-06-15 NOTE — Telephone Encounter (Signed)
Okay #30 and 3 refills Database information reviewed, no apparent problems

## 2017-06-23 ENCOUNTER — Emergency Department (HOSPITAL_COMMUNITY): Payer: Medicare Other

## 2017-06-23 ENCOUNTER — Emergency Department (HOSPITAL_COMMUNITY)
Admission: EM | Admit: 2017-06-23 | Discharge: 2017-06-23 | Disposition: A | Discharge status: Home / Self Care | Payer: Medicare Other | Attending: Emergency Medicine | Admitting: Emergency Medicine

## 2017-06-23 ENCOUNTER — Encounter (HOSPITAL_COMMUNITY): Payer: Self-pay | Admitting: Emergency Medicine

## 2017-06-23 DIAGNOSIS — Y92480 Sidewalk as the place of occurrence of the external cause: Secondary | ICD-10-CM | POA: Insufficient documentation

## 2017-06-23 DIAGNOSIS — W01198A Fall on same level from slipping, tripping and stumbling with subsequent striking against other object, initial encounter: Secondary | ICD-10-CM | POA: Diagnosis not present

## 2017-06-23 DIAGNOSIS — Z87891 Personal history of nicotine dependence: Secondary | ICD-10-CM | POA: Insufficient documentation

## 2017-06-23 DIAGNOSIS — J449 Chronic obstructive pulmonary disease, unspecified: Secondary | ICD-10-CM | POA: Diagnosis not present

## 2017-06-23 DIAGNOSIS — Y93K1 Activity, walking an animal: Secondary | ICD-10-CM | POA: Insufficient documentation

## 2017-06-23 DIAGNOSIS — I10 Essential (primary) hypertension: Secondary | ICD-10-CM | POA: Diagnosis not present

## 2017-06-23 DIAGNOSIS — Y999 Unspecified external cause status: Secondary | ICD-10-CM | POA: Diagnosis not present

## 2017-06-23 DIAGNOSIS — I2510 Atherosclerotic heart disease of native coronary artery without angina pectoris: Secondary | ICD-10-CM | POA: Insufficient documentation

## 2017-06-23 DIAGNOSIS — R51 Headache: Secondary | ICD-10-CM | POA: Diagnosis not present

## 2017-06-23 DIAGNOSIS — S0181XA Laceration without foreign body of other part of head, initial encounter: Secondary | ICD-10-CM | POA: Insufficient documentation

## 2017-06-23 DIAGNOSIS — S12001A Unspecified nondisplaced fracture of first cervical vertebra, initial encounter for closed fracture: Secondary | ICD-10-CM | POA: Diagnosis not present

## 2017-06-23 DIAGNOSIS — S61212A Laceration without foreign body of right middle finger without damage to nail, initial encounter: Secondary | ICD-10-CM | POA: Insufficient documentation

## 2017-06-23 MED ORDER — MORPHINE SULFATE (PF) 2 MG/ML IV SOLN
6.0000 mg | Freq: Once | INTRAVENOUS | Status: AC
  Administered 2017-06-23: 6 mg via INTRAVENOUS

## 2017-06-23 MED ORDER — MORPHINE SULFATE (PF) 2 MG/ML IV SOLN
6.0000 mg | Freq: Once | INTRAVENOUS | Status: AC
  Administered 2017-06-23: 6 mg via INTRAVENOUS
  Filled 2017-06-23: qty 3

## 2017-06-23 MED ORDER — MORPHINE SULFATE (PF) 2 MG/ML IV SOLN
6.0000 mg | Freq: Once | INTRAVENOUS | Status: DC
  Filled 2017-06-23: qty 3

## 2017-06-23 MED ORDER — LORAZEPAM 2 MG/ML IJ SOLN
1.0000 mg | Freq: Once | INTRAMUSCULAR | Status: AC
Start: 2017-06-23 — End: 2017-06-23
  Administered 2017-06-23: 1 mg via INTRAVENOUS

## 2017-06-23 MED ORDER — LIDOCAINE-EPINEPHRINE (PF) 2 %-1:200000 IJ SOLN
10.0000 mL | Freq: Once | INTRAMUSCULAR | Status: AC
  Administered 2017-06-23: 10 mL
  Filled 2017-06-23: qty 20

## 2017-06-23 MED ORDER — LORAZEPAM 2 MG/ML IJ SOLN
1.0000 mg | Freq: Once | INTRAMUSCULAR | Status: DC
  Filled 2017-06-23: qty 1

## 2017-06-23 MED ORDER — OXYCODONE-ACETAMINOPHEN 5-325 MG PO TABS: 1.0000 | ORAL_TABLET | ORAL | 0 refills | Status: DC | PRN

## 2017-06-23 MED ORDER — DIAZEPAM 5 MG PO TABS: 2.5000 mg | ORAL_TABLET | Freq: Three times a day (TID) | ORAL | 0 refills | Status: DC | PRN

## 2017-06-23 NOTE — ED Triage Notes (Signed)
Pt in via EMS post fall- Per EMS, pt was walking his dog on uneven pavement and tripped. Pt attempted to get up and fell into large mailboxes hitting his forehead. Pt has head lac. Pt also has injury to R hand. Pt has hx of chronic arthritis and pain which he feels is greatly amplified by these falls. Pt is A&O and in NAD

## 2017-06-23 NOTE — Discharge Instructions (Signed)
Wear collar at all times. Take pain medication and muscle relaxant as needed. Follow-up with Dr Franky Machoabbell in 1-2 weeks. Sutures removed from face in 5 days and 10 days from finger.

## 2017-06-23 NOTE — ED Provider Notes (Signed)
[facility]-EMERGENCY DEPT Provider Note    By signing my name below, I, Earmon PhoenixJennifer Waddell, attest that this documentation has been prepared under the direction and in the presence of Raeford RazorKohut, Carle Fenech, MD. Electronically Signed: Earmon PhoenixJennifer Waddell, ED Scribe. 06/23/17. 3:15 PM.    History   Chief Complaint Chief Complaint  Patient presents with  . Fall  . Head Injury  . Hand Injury   The history is provided by the patient and medical records. No language interpreter was used.    Dakota [name] is a 83 y.o. male brought in by EMS who presents to the Emergency Department complaining of a mechanical fall while walking his dog PTA. He states he tripped over uneven pavement causing him to fall twice, striking his head on a mailbox. He was able to stand back up on his own and walk back home. He reports associated right hand pain and a laceration to the forehead. He states he has pain to the back, neck and head. He has not taken anything for pain. Movements increase his pain. He denies alleviating factors. He denies LOC, nausea, vomiting, visual changes, numbness, tingling or weakness of any extremity. He denies being on anticoagulant therapy. His last tetanus vaccination was 08/2015.   Past Medical History:  Diagnosis Date  . 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 BPH (benign prostatic hypertrophy)   . CAD (coronary artery disease)    per cath: mild/non obstructive 03/2006  . Chronic neck pain    and HA, dx w/ cervical stenosis  . COPD (chronic obstructive pulmonary disease) (HCC)   . Depression   . Diverticulosis of colon (without mention of hemorrhage)   . Gallbladder & bile duct stone    per US 2007  . GERD (gastroesophageal reflux disease)   . Hyperlipidemia   . Hyperplastic colon polyp   . Hypertension   . Lumbar spondylosis    Facet mediated pain, Dr. Ethelene Halamos  . Osteopenia   . Peptic ulcer disease   . Pulmonary nodule    s/p Ct x 2 last 05/2008: scarring  .  Thyroid disease     Patient Active Problem List   Diagnosis Date Noted  . PCP NOTES >>>>>> 12/08/2015  . Screening for prostate cancer 02/08/2015  . Dysphagia, pharyngoesophageal phase 02/07/2015  . Rib pain on left side 02/07/2015  . Palpable abd. aorta 01/21/2015  . Unspecified vitamin D deficiency 05/08/2014  . Prediabetes 07/21/2013  . Hypothyroidism 07/05/2012  . Weight loss -- fatigue-- night sweats 02/20/2012  . PMR (polymyalgia rheumatica) (HCC) 02/20/2012  . DJD (degenerative joint disease) 04/05/2011  . SHORTNESS OF BREATH 07/21/2009  . PREMATURE VENTRICULAR CONTRACTIONS 07/19/2009  . PALPITATIONS, RECURRENT 07/19/2009  . Hypogonadism in male 03/12/2009  . BACK PAIN 11/02/2008  . CBC, ABNORMAL 11/02/2008  . ANEURYSM OF ARTERY OF NECK 08/31/2008  . LIVER FUNCTION TESTS, ABNORMAL 05/20/2008  . CORONARY ARTERY DISEASE 02/07/2008  . NODULAR PROSTATE WITHOUT URINARY OBSTRUCTION 02/07/2008  . OSTEOPENIA 02/07/2008  . NECK PAIN, CHRONIC 12/10/2007  . Hyperlipidemia 01/22/2007  . Depression 01/22/2007  . CATARACT NOS 01/22/2007  . COPD (chronic obstructive pulmonary disease) (HCC) 01/22/2007  . GERD 01/22/2007  . PEPTIC ULCER DISEASE 01/22/2007  . DIVERTICULOSIS, COLON 01/22/2007  . BPH, SCC of the penis  and prostate nodule 01/22/2007  . HX, PERSONAL, MALIGNANCY, SKIN NEC 01/22/2007  . Elevated LFTs 01/22/2007  . COLONIC POLYPS, HX OF 01/22/2007    Past Surgical History:  Procedure Laterality Date  . ARTHOSCOPIC ROTAOR CUFF REPAIR     L-2003, R-2004 Dr Ranell Patrick  . FACET JOINT INJECTION  2016   Bilateral L4-5 and L5-S1, Dr. Ethelene Hal  . INGUINAL HERNIA REPAIR     r  . SPINAL CORD STIMULATOR INSERTION  5/11   lower back  . ulcer surgery  1978       Home Medications    Prior to Admission medications   Medication Sig Start Date End Date Taking? Authorizing Provider  albuterol (PROAIR HFA) 108 (90 Base) MCG/ACT inhaler Inhale 2 puffs into the lungs every 6 (six)  hours as needed for wheezing or shortness of breath. 07/25/16   Wanda Plump, MD  atorvastatin (LIPITOR) 20 MG tablet Take 1 tablet (20 mg total) by mouth at bedtime. 03/23/17   Wanda Plump, MD  azelastine (ASTELIN) 0.1 % nasal spray Place 2 sprays into both nostrils at bedtime as needed for rhinitis. Use in each nostril as directed 07/25/16   Wanda Plump, MD  CYCLOSET 0.8 MG TABS Take 1 tablet (0.8 mg total) by mouth daily. 07/25/16   Wanda Plump, MD  Esomeprazole Magnesium (NEXIUM PO) Take by mouth. OTC , takes one every AM    [provider]  HYDROmorphone (DILAUDID) 8 MG tablet  03/03/13   [provider]  levothyroxine (SYNTHROID, LEVOTHROID) 75 MCG tablet Take 1 tablet (75 mcg total) by mouth daily before breakfast. 12/12/16   Wanda Plump, MD  mometasone-formoterol [facility]) 100-5 MCG/ACT AERO Inhale 2 puffs into the lungs 2 (two) times daily. 01/31/17   Wanda Plump, MD  tiotropium (SPIRIVA HANDIHALER) 18 MCG inhalation capsule Place 1 capsule (18 mcg total) into inhaler and inhale daily. 03/12/17   Wanda Plump, MD  venlafaxine XR (EFFEXOR-XR) 75 MG 24 hr capsule Take 1 capsule (75 mg total) by mouth 2 (two) times daily. 02/19/17   Wanda Plump, MD  zolpidem (AMBIEN) 5 MG tablet Take 1 tablet (5 mg total) by mouth at bedtime as needed for sleep. 06/15/17   Wanda Plump, MD    Family History Family History  Problem Relation Age of Onset  . Prostate cancer Father   . Prostate cancer Brother        spead to his bladder  . Heart attack Neg Hx   . Diabetes Neg Hx   . Colon cancer Neg Hx   . Stomach cancer Neg Hx     Social History Social History  Substance Use Topics  . Smoking status: Former Games developer  . Smokeless tobacco: Never Used     Comment: 2011 aprox  . Alcohol use 1.2 oz/week    2 Shots of liquor per week     Comment: 2 mixed drinks a week     Allergies   Sulfonamide derivatives   Review of Systems Review of Systems All other systems reviewed and are negative for  acute change except as noted in the HPI.   Physical Exam Updated Vital Signs BP (!) 146/89   Pulse 78   Temp 97.7 F (36.5 C) (Oral)   Resp 16   SpO2 100%   Physical Exam  Constitutional: He is oriented to person, place, and time. He appears well-developed and well-nourished.  HENT:  Head: Normocephalic.  3 cm laceration near center of forehead. Multiple facial and scalp abrasions.  Eyes: EOM are normal.  Neck: Normal range of motion.  Cardiovascular: Normal rate, regular rhythm and normal heart sounds.  Pulmonary/Chest: Effort normal and breath sounds normal.  Abdominal: He exhibits no distension.  Musculoskeletal: He exhibits tenderness.  Laceration over the PIP right third finger, extensor tendon visible but appears intact through ROM. Can extend against resistance. Sensation intact b/l distal to lac. Brisk cap refill in finger tip.  Abrasions and tenderness to bilateral knees.  Neurological: He is alert and oriented to person, place, and time.  NVI distally.  Psychiatric: He has a normal mood and affect.  Nursing note and vitals reviewed.    ED Treatments / Results  DIAGNOSTIC STUDIES: Oxygen Saturation is 100% on RA, normal by my interpretation.   COORDINATION OF CARE: 1:39 PM- Will order pain medications, imaging and suture wounds. Pt verbalizes understanding and agrees to plan.  2:54 PM- Radiologist called to inform of C-1 fracture.   Medications  lidocaine-EPINEPHrine (XYLOCAINE W/EPI) 2 %-1:200000 (PF) injection 10 mL (not administered)  LORazepam (ATIVAN) injection 1 mg (1 mg Intravenous Given 06/23/17 1453)  morphine 2 MG/ML injection 6 mg (6 mg Intravenous Given 06/23/17 1453)    Labs (all labs ordered are listed, but only abnormal results are displayed) Labs Reviewed - No data to display  EKG  EKG Interpretation None       Radiology Ct Head Wo Contrast  Result Date: 06/23/2017 CLINICAL DATA:  83 year old male with a history of fall EXAM: CT HEAD  WITHOUT CONTRAST CT CERVICAL SPINE WITHOUT CONTRAST TECHNIQUE: Multidetector CT imaging of the head and cervical spine was performed following the standard protocol without intravenous contrast. Multiplanar CT image reconstructions of the cervical spine were also generated. COMPARISON:  09/01/2016, 03/30/2016 FINDINGS: CT HEAD FINDINGS Brain: No acute intracranial hemorrhage. No midline shift or mass effect. Gray-white differentiation maintained. Patchy hypodensity in the bilateral periventricular white matter. Mild volume loss. Vascular: Calcifications of the anterior and posterior circulation. Skull: Soft tissue defect in the high frontal scalp in the midline. Surgical gauze dressing in place. No underlying fracture. No radiopaque foreign body. Sinuses/Orbits: Unremarkable appearance of the orbits. Minimal soft tissue fullness in the right sphenoid sinus. Other: None CT CERVICAL SPINE FINDINGS Alignment: Alignment of the cervical vertebral bodies is unchanged compared to the prior CT of 09/01/2016. No subluxation. Trace anterolisthesis of C4 on C5 is unchanged. Skull base and vertebrae: No skullbase fracture identified. Craniocervical junction maintains alignment. There is a new defect of the anterior ring of C1 compared to the CT of 09/01/2016. No additional fracture line identified. Vertebral body heights maintained. Soft tissues and spinal canal: No canal hematoma identified. Disc levels: Multilevel degenerative changes. Disc space narrowing with endplate changes are most pronounced at C6-C7 and C7-T1. Bilateral facet disease most pronounced on the left spanning C2- C6. Upper chest: Scarring at the bilateral apices is unchanged from the comparison CT. Centrilobular emphysema. Other: None IMPRESSION: Head CT: No CT evidence of acute intracranial abnormality. Evidence of chronic microvascular ischemic disease and associated intracranial atherosclerosis. Soft tissue injury of the frontal scalp Cervical CT: There  is a new fracture of the anterior ring of C1, with no malalignment. No additional fracture line identified. Multilevel degenerative changes of the cervical spine with no bony canal narrowing. These results were called by telephone at the time of interpretation on 06/23/2017 at 2:52 pm to Dr. Raeford Razor , who verbally acknowledged these results. Electronically Signed   By: Gilmer Mor D.O.   On: 06/23/2017 14:54   Ct Cervical Spine Wo Contrast  Result Date: 06/23/2017 CLINICAL DATA:  83 year old male with a history of fall  EXAM: CT HEAD WITHOUT CONTRAST CT CERVICAL SPINE WITHOUT CONTRAST TECHNIQUE: Multidetector CT imaging of the head and cervical spine was performed following the standard protocol without intravenous contrast. Multiplanar CT image reconstructions of the cervical spine were also generated. COMPARISON:  09/01/2016, 03/30/2016 FINDINGS: CT HEAD FINDINGS Brain: No acute intracranial hemorrhage. No midline shift or mass effect. Gray-white differentiation maintained. Patchy hypodensity in the bilateral periventricular white matter. Mild volume loss. Vascular: Calcifications of the anterior and posterior circulation. Skull: Soft tissue defect in the high frontal scalp in the midline. Surgical gauze dressing in place. No underlying fracture. No radiopaque foreign body. Sinuses/Orbits: Unremarkable appearance of the orbits. Minimal soft tissue fullness in the right sphenoid sinus. Other: None CT CERVICAL SPINE FINDINGS Alignment: Alignment of the cervical vertebral bodies is unchanged compared to the prior CT of 09/01/2016. No subluxation. Trace anterolisthesis of C4 on C5 is unchanged. Skull base and vertebrae: No skullbase fracture identified. Craniocervical junction maintains alignment. There is a new defect of the anterior ring of C1 compared to the CT of 09/01/2016. No additional fracture line identified. Vertebral body heights maintained. Soft tissues and spinal canal: No canal hematoma  identified. Disc levels: Multilevel degenerative changes. Disc space narrowing with endplate changes are most pronounced at C6-C7 and C7-T1. Bilateral facet disease most pronounced on the left spanning C2- C6. Upper chest: Scarring at the bilateral apices is unchanged from the comparison CT. Centrilobular emphysema. Other: None IMPRESSION: Head CT: No CT evidence of acute intracranial abnormality. Evidence of chronic microvascular ischemic disease and associated intracranial atherosclerosis. Soft tissue injury of the frontal scalp Cervical CT: There is a new fracture of the anterior ring of C1, with no malalignment. No additional fracture line identified. Multilevel degenerative changes of the cervical spine with no bony canal narrowing. These results were called by telephone at the time of interpretation on 06/23/2017 at 2:52 pm to Dr. Raeford Razor , who verbally acknowledged these results. Electronically Signed   By: Gilmer Mor D.O.   On: 06/23/2017 14:54   Dg Knee Complete 4 Views Right  Result Date: 06/23/2017 CLINICAL DATA:  Trip and fall with right knee injury. Initial encounter. EXAM: RIGHT KNEE - COMPLETE 4+ VIEW COMPARISON:  None. FINDINGS: No evidence of fracture, dislocation, or joint effusion. No significant arthropathy identified. Calcified plaque is visualized in the distal SFA and popliteal artery. No bony lesions. IMPRESSION: No acute findings. Atherosclerosis of the right SFA and popliteal artery. Electronically Signed   By: Irish Lack M.D.   On: 06/23/2017 14:52   Dg Finger Middle Right  Result Date: 06/23/2017 CLINICAL DATA:  Right middle finger injury after a fall. EXAM: RIGHT MIDDLE FINGER 2+V COMPARISON:  None. FINDINGS: No fracture. No subluxation. Degenerative changes are noted at the IP joints. There may be some gas in the soft tissues around the PIP joint raising the question of laceration. IMPRESSION: No acute bony abnormality. Electronically Signed   By: Kennith Center M.D.    On: 06/23/2017 14:49    Procedures .Marland KitchenLaceration Repair Date/Time: 06/23/2017 3:15 PM Performed by: Raeford Razor Authorized by: Raeford Razor   Consent:    Consent obtained:  Verbal   Consent given by:  Patient Anesthesia (see MAR for exact dosages):    Anesthesia method:  Local infiltration   Local anesthetic:  Lidocaine 2% WITH epi Laceration details:    Location:  Face   Face location:  Forehead   Length (cm):  3 Pre-procedure details:    Preparation:  Patient was prepped  and draped in usual sterile fashion and imaging obtained to evaluate for foreign bodies Exploration:    Hemostasis achieved with:  Direct pressure   Wound exploration: wound explored through full range of motion and entire depth of wound probed and visualized     Wound extent: no foreign bodies/material noted     Contaminated: no   Treatment:    Area cleansed with:  Betadine   Amount of cleaning:  Extensive Post-procedure details:    Patient tolerance of procedure:  Tolerated well, no immediate complications .Marland KitchenLaceration Repair Date/Time: 06/23/2017 3:15 PM Performed by: Raeford Razor Authorized by: Raeford Razor   Consent:    Consent obtained:  Verbal   Consent given by:  Patient Anesthesia (see MAR for exact dosages):    Anesthesia method:  Local infiltration Laceration details:    Location:  Finger   Finger location:  R long finger Pre-procedure details:    Preparation:  Patient was prepped and draped in usual sterile fashion Exploration:    Hemostasis achieved with:  Direct pressure   Wound exploration: wound explored through full range of motion and entire depth of wound probed and visualized     Wound extent: no foreign bodies/material noted and no tendon damage noted     Contaminated: no   Treatment:    Area cleansed with:  Saline   Amount of cleaning:  Extensive   Irrigation solution:  Sterile water   Irrigation method:  Pressure wash   Visualized foreign bodies/material removed: no    Skin repair:    Repair method:  Sutures   Suture size:  4-0   Suture material:  Prolene   Suture technique:  Simple interrupted   Number of sutures:  5 Approximation:    Approximation:  Close   Vermilion border: well-aligned   Post-procedure details:    Dressing:  Antibiotic ointment   Patient tolerance of procedure:  Tolerated well, no immediate complications Comments:     Extensor tendon visible. Appears intact through ROM. Can extend       (including critical care time)  Medications Ordered in ED Medications  lidocaine-EPINEPHrine (XYLOCAINE W/EPI) 2 %-1:200000 (PF) injection 10 mL (not administered)  LORazepam (ATIVAN) injection 1 mg (1 mg Intravenous Given 06/23/17 1453)  morphine 2 MG/ML injection 6 mg (6 mg Intravenous Given 06/23/17 1453)     Initial Impression / Assessment and Plan / ED Course  I have reviewed the triage vital signs and the nursing notes.  Pertinent labs & imaging results that were available during my care of the patient were reviewed by me and considered in my medical decision making (see chart for details).      83yM s/p fall. c1 fx. Neurologically intact. Cervical collar. Neurosurgery consultation. Lac repair of forehead and finger lacs.   3:29 PM Discussed with neurosurgery. c-collar at all times. FU in office. Wound care for lacerations/abrasions. PRN pain meds.   Final Clinical Impressions(s) / ED Diagnoses   Final diagnoses:  Closed nondisplaced fracture of first cervical vertebra, unspecified fracture morphology, initial encounter (HCC)  Laceration of forehead, initial encounter  Laceration of right middle finger without foreign body, nail damage status unspecified, initial encounter    New Prescriptions Discharge Medication List as of 06/23/2017  4:56 PM    START taking these medications   Details  diazepam (VALIUM) 5 MG tablet Take 0.5-1 tablets (2.5-5 mg total) by mouth every 8 (eight) hours as needed for muscle spasms., Starting  Sat 06/23/2017, Print    oxyCODONE-acetaminophen (  PERCOCET/ROXICET) 5-325 MG tablet Take 1-2 tablets by mouth every 4 (four) hours as needed for severe pain., Starting Sat 06/23/2017, Print        I personally preformed the services scribed in my presence. The recorded information has been reviewed is accurate. Raeford Razor, MD.      Raeford Razor, MD 06/27/17 [phone]

## 2017-06-23 NOTE — ED Notes (Signed)
Bed: [facility]18 Expected date:  Expected time:  Means of arrival:  Comments: fall 

## 2017-07-04 ENCOUNTER — Other Ambulatory Visit: Payer: Self-pay | Admitting: Internal Medicine

## 2017-07-10 ENCOUNTER — Other Ambulatory Visit: Payer: Self-pay | Admitting: Internal Medicine

## 2017-07-12 ENCOUNTER — Other Ambulatory Visit: Payer: Self-pay | Admitting: Internal Medicine

## 2017-08-09 ENCOUNTER — Other Ambulatory Visit: Payer: Self-pay | Admitting: Internal Medicine

## 2017-08-17 ENCOUNTER — Other Ambulatory Visit: Payer: Self-pay | Admitting: Student

## 2017-08-17 DIAGNOSIS — S12091D Other nondisplaced fracture of first cervical vertebra, subsequent encounter for fracture with routine healing: Secondary | ICD-10-CM

## 2017-08-21 ENCOUNTER — Other Ambulatory Visit: Payer: Medicare Other

## 2017-08-29 ENCOUNTER — Other Ambulatory Visit: Payer: Self-pay | Admitting: Internal Medicine

## 2017-08-30 ENCOUNTER — Ambulatory Visit: Payer: Medicare Other | Admitting: Internal Medicine

## 2017-08-31 ENCOUNTER — Other Ambulatory Visit: Payer: Self-pay

## 2017-08-31 MED ORDER — ALBUTEROL SULFATE HFA 108 (90 BASE) MCG/ACT IN AERS: 2.0000 | INHALATION_SPRAY | Freq: Four times a day (QID) | RESPIRATORY_TRACT | 1 refills | Status: DC | PRN

## 2017-09-03 ENCOUNTER — Ambulatory Visit
Admission: RE | Admit: 2017-09-03 | Discharge: 2017-09-03 | Disposition: A | Discharge status: Home / Self Care | Payer: Medicare Other | Source: Ambulatory Visit | Attending: Student | Admitting: Student

## 2017-09-03 ENCOUNTER — Other Ambulatory Visit: Payer: Self-pay | Admitting: Student

## 2017-09-03 DIAGNOSIS — S12091D Other nondisplaced fracture of first cervical vertebra, subsequent encounter for fracture with routine healing: Secondary | ICD-10-CM

## 2017-09-06 ENCOUNTER — Encounter: Payer: Self-pay | Admitting: Internal Medicine

## 2017-09-06 ENCOUNTER — Other Ambulatory Visit: Payer: Self-pay | Admitting: Internal Medicine

## 2017-09-06 ENCOUNTER — Ambulatory Visit (INDEPENDENT_AMBULATORY_CARE_PROVIDER_SITE_OTHER): Payer: Medicare Other | Admitting: Internal Medicine

## 2017-09-06 VITALS — BP 132/60 | HR 68 | Temp 98.2°F | Resp 14 | Ht 63.0 in | Wt 135.1 lb

## 2017-09-06 DIAGNOSIS — S12001D Unspecified nondisplaced fracture of first cervical vertebra, subsequent encounter for fracture with routine healing: Secondary | ICD-10-CM

## 2017-09-06 DIAGNOSIS — E785 Hyperlipidemia, unspecified: Secondary | ICD-10-CM | POA: Diagnosis not present

## 2017-09-06 DIAGNOSIS — M8580 Other specified disorders of bone density and structure, unspecified site: Secondary | ICD-10-CM | POA: Diagnosis not present

## 2017-09-06 DIAGNOSIS — E034 Atrophy of thyroid (acquired): Secondary | ICD-10-CM

## 2017-09-06 DIAGNOSIS — M353 Polymyalgia rheumatica: Secondary | ICD-10-CM

## 2017-09-06 DIAGNOSIS — Z23 Encounter for immunization: Secondary | ICD-10-CM

## 2017-09-06 DIAGNOSIS — R7982 Elevated C-reactive protein (CRP): Secondary | ICD-10-CM | POA: Diagnosis not present

## 2017-09-06 DIAGNOSIS — E559 Vitamin D deficiency, unspecified: Secondary | ICD-10-CM

## 2017-09-06 LAB — TSH: TSH: 3.16 u[IU]/mL (ref 0.35–4.50)

## 2017-09-06 LAB — COMPREHENSIVE METABOLIC PANEL
ALBUMIN: 4 g/dL (ref 3.5–5.2)
ALK PHOS: 77 U/L (ref 39–117)
ALT: 17 U/L (ref 0–53)
AST: 23 U/L (ref 0–37)
BUN: 16 mg/dL (ref 6–23)
CALCIUM: 9.4 mg/dL (ref 8.4–10.5)
CO2: 30 mEq/L (ref 19–32)
Chloride: 101 mEq/L (ref 96–112)
Creatinine, Ser: 1.04 mg/dL (ref 0.40–1.50)
GFR: 72.65 mL/min (ref >60.00)
GLUCOSE: 81 mg/dL (ref 70–99)
POTASSIUM: 4.1 meq/L (ref 3.5–5.1)
Sodium: 137 mEq/L (ref 135–145)
TOTAL PROTEIN: 7.1 g/dL (ref 6.0–8.3)
Total Bilirubin: 0.4 mg/dL (ref 0.2–1.2)

## 2017-09-06 LAB — HIGH SENSITIVITY CRP: CRP, High Sensitivity: 19.76 mg/L — ABNORMAL HIGH (ref 0.000–5.000)

## 2017-09-06 LAB — LIPID PANEL
CHOL/HDL RATIO: 2
Cholesterol: 124 mg/dL (ref 0–200)
HDL: 68.6 mg/dL (ref >39.00)
LDL CALC: 33 mg/dL (ref 0–99)
NONHDL: 55.65
Triglycerides: 114 mg/dL (ref 0.0–149.0)
VLDL: 22.8 mg/dL (ref 0.0–40.0)

## 2017-09-06 LAB — SEDIMENTATION RATE: Sed Rate: 17 mm/hr (ref 0–20)

## 2017-09-06 MED ORDER — ESCITALOPRAM OXALATE 10 MG PO TABS
10.0000 mg | ORAL_TABLET | Freq: Every day | ORAL | 1 refills | Status: DC
Start: 2017-09-06 — End: 2017-09-25

## 2017-09-06 NOTE — Patient Instructions (Addendum)
GO TO THE LAB : Get the blood work     GO TO THE FRONT DESK Schedule your next appointment for a  Follow-up in 4 weeks  Please taper down Effexor: Take 1 tablet daily for 2 weeks, then stop  Start Lexapro, a new medication for depression . Okay to start tomorrow

## 2017-09-06 NOTE — Progress Notes (Signed)
Subjective:    Patient ID: Dakota [name], male    DOB: 1933-07-04, 83 y.o.   MRN: [id number]  DOS:  09/06/2017 Type of visit - description : rov Interval history: Since the last visit, he had a fall 02-2017 and had a cervical spine fracture. Was treated conservatively, still on pain despite taking pain medication. Depression: Still an issue, on Effexor, denies any suicidal ideas COPD. Good medication compliance, overall feels that his breathing is better, still has occasional cough but no hemoptysis.  Wt Readings from Last 3 Encounters:  09/06/17 135 lb 2 oz (61.3 kg)  02/19/17 138 lb 8 oz (62.8 kg)  07/25/16 142 lb 2 oz (64.5 kg)    Review of Systems Denies fever, chills or headaches. Has lost 3 pounds since last visit Since he had a fracture, joints in general are more achy. Denies visual disturbances, dysuria, gross hematuria or cough.  Past Medical History:  Diagnosis Date  . 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 BPH (benign prostatic hypertrophy)   . CAD (coronary artery disease)    per cath: mild/non obstructive 03/2006  . Chronic neck pain    and HA, dx w/ cervical stenosis  . COPD (chronic obstructive pulmonary disease) (HCC)   . Depression   . Diverticulosis of colon (without mention of hemorrhage)   . Gallbladder & bile duct stone    per [place] 2007  . GERD (gastroesophageal reflux disease)   . Hyperlipidemia   . Hyperplastic colon polyp   . Hypertension   . Lumbar spondylosis    Facet mediated pain, Dr. Ethelene Hal  . Osteopenia   . Peptic ulcer disease   . Pulmonary nodule    s/p Ct x 2 last 05/2008: scarring  . Thyroid disease     Past Surgical History:  Procedure Laterality Date  . ARTHOSCOPIC ROTAOR CUFF REPAIR     L-2003, R-2004 Dr Ranell Patrick  . FACET JOINT INJECTION  2016   Bilateral L4-5 and L5-S1, Dr. Ethelene Hal  . INGUINAL HERNIA REPAIR     r  . SPINAL CORD STIMULATOR INSERTION  5/11   lower back  . ulcer surgery  1978    Social  History   Social History  . Marital status: Married    Spouse name: N/A  . Number of children: 2  . Years of education: N/A   Occupational History  . part time     school crossing guard   Social History Main Topics  . Smoking status: Former Games developer  . Smokeless tobacco: Never Used     Comment: 2011 aprox  . Alcohol use 1.2 oz/week    2 Shots of liquor per week     Comment: 2 mixed drinks a week  . Drug use: No  . Sexual activity: No   Other Topics Concern  . Not on file   Social History Narrative   Lives w/ wife, she retired ~02-2016   Daugther is medical PA            Allergies as of 09/06/2017      Reactions   Sulfonamide Derivatives Rash      Medication List       Accurate as of 09/06/17 11:06 AM. Always use your most recent med list.          albuterol 108 (90 Base) MCG/ACT inhaler Commonly known as:  PROAIR HFA Inhale 2 puffs into the lungs every 6 (six) hours as  needed for wheezing or shortness of breath.   atorvastatin 20 MG tablet Commonly known as:  LIPITOR Take 1 tablet (20 mg total) by mouth at bedtime.   azelastine 0.1 % nasal spray Commonly known as:  ASTELIN Place 2 sprays into both nostrils at bedtime as needed for rhinitis. Use in each nostril as directed   CYCLOSET 0.8 MG Tabs Generic drug:  Bromocriptine Mesylate Take 1 tablet (0.8 mg total) by mouth daily.   diazepam 5 MG tablet Commonly known as:  VALIUM Take 0.5-1 tablets (2.5-5 mg total) by mouth every 8 (eight) hours as needed for muscle spasms.   HYDROmorphone 8 MG tablet Commonly known as:  DILAUDID Take 8 mg by mouth every 4 (four) hours as needed for moderate pain.   levothyroxine 75 MCG tablet Commonly known as:  SYNTHROID, LEVOTHROID Take 1 tablet (75 mcg total) by mouth daily before breakfast.   mometasone-formoterol 100-5 MCG/ACT Aero Commonly known as:  DULERA Inhale 2 puffs into the lungs 2 (two) times daily.   NASACORT ALLERGY 24HR 55 MCG/ACT Aero nasal  inhaler Generic drug:  triamcinolone Place 2 sprays into the nose daily.   NEXIUM PO Take by mouth. OTC , takes one every AM   oxyCODONE-acetaminophen 5-325 MG tablet Commonly known as:  PERCOCET/ROXICET Take 1-2 tablets by mouth every 4 (four) hours as needed for severe pain.   tiotropium 18 MCG inhalation capsule Commonly known as:  SPIRIVA HANDIHALER Place 1 capsule (18 mcg total) into inhaler and inhale daily.   venlafaxine XR 75 MG 24 hr capsule Commonly known as:  EFFEXOR-XR Take 1 capsule (75 mg total) by mouth 2 (two) times daily.   zolpidem 5 MG tablet Commonly known as:  AMBIEN Take 1 tablet (5 mg total) by mouth at bedtime as needed for sleep.          Objective:   Physical Exam BP 132/60 (BP Location: Left Arm, Patient Position: Sitting, Cuff Size: Small)   Pulse 68   Temp 98.2 F (36.8 C) (Oral)   Resp 14   Ht  (1.6 m)   Wt 135 lb 2 oz (61.3 kg)   SpO2 94%   BMI 23.94 kg/m  General:   Well developed, elderly patient, in no acute distress. HEENT:  Normocephalic . Face symmetric, atraumatic Neck: Has an Aspen collar in place Lungs:  CTA B Normal respiratory effort, no intercostal retractions, no accessory muscle use. Heart: RRR,  no murmur.  No pretibial edema bilaterally  Skin: Not pale. Not jaundice Neurologic:  alert & oriented X3.  Speech normal, gait assisted by a cane, at baseline. Psych--  Cognition and judgment appear intact.  Cooperative with normal attention span and concentration.  Behavior appropriate. He sees somewhat depressed but not anxious. Not far from baseline.     Assessment & Plan:  Assessment: Prediabetes Hyperlipidemia Hypothyroidism Hypogonadism-- not on HRT, on bromocriptine  Depression, insomnia (effexor, ambien; xanax dc 12-2015,not using them) COPD: As of 07-2016, previous pulmonary referral and PFTs failed. GI:   ---GERD, PUD, diverticulosis, colon polyps ---Elevated LFTs Osteopenia CV: Aneurysm, left  ICA 2 mm 2009, saw neurology Mild non-obstructive CAD per cath 2007 MSK: ---Pain med rx by Dr Ethelene Hal --Lumbar spondylosis --Spinal cord stimulation 2011 --Chronic neck pain and headache, DX cervical stenosis --C1 close fracture 02-2017, conservative management per neurosurgery H/o Weight loss, fatigue, sweats: On and off symptoms since at least 2013, see previous entries  Palpable aorta, ultrasound (-) H/o vit d def ?PMR 2013  PLAN Hyperlipidemia: On atorvastatin, check a CMP and FLP. Hypothyroidism, on Synthroid, check a TSH Depression and insomnia: Ongoing issue, not suicidal ideas, on Effexor  150 mg daily for long time, symptoms not well-controlled. Taper Effexor down, see instructions. Switch to LexaproI likely will need to add Wellbutrin or a second agent later. Follow-up in 4 weeks. Osteopenia: Now with a C1 fracture. Will check a bone density test Vitamin D deficiency: Check levels Generalized aches and pains.. History of PMR. Will check a sedimentation rate and CRP. Pain mngmt: per Dr Ethelene Hal  RTC 4 weeks

## 2017-09-06 NOTE — Progress Notes (Signed)
Pre visit review using our clinic review tool, if applicable. No additional management support is needed unless otherwise documented below in the visit note. 

## 2017-09-07 ENCOUNTER — Telehealth: Payer: Self-pay | Admitting: Internal Medicine

## 2017-09-07 MED ORDER — VENLAFAXINE HCL ER 75 MG PO CP24: ORAL_CAPSULE | ORAL | 0 refills | Status: DC

## 2017-09-07 NOTE — Telephone Encounter (Signed)
Plan at end of OV yesterday to taper Effexor-XR : 1 capsule by mouth daily for 2 weeks then stop. Rx for 14 capsules sent.

## 2017-09-07 NOTE — Telephone Encounter (Signed)
Pt has only one tablet left Effexor. [facility] [address] [facility]. Pt [phone].

## 2017-09-10 LAB — VITAMIN D 1,25 DIHYDROXY
VITAMIN D 1, 25 (OH) TOTAL: 39 pg/mL (ref 18–72)
VITAMIN D3 1, 25 (OH): 39 pg/mL
Vitamin D2 1, 25 (OH)2: <8 pg/mL

## 2017-09-10 MED ORDER — PREDNISONE 5 MG PO TABS: 15.0000 mg | ORAL_TABLET | Freq: Every day | ORAL | 1 refills | Status: DC

## 2017-09-10 NOTE — Assessment & Plan Note (Signed)
Hyperlipidemia: On atorvastatin, check a CMP and FLP. Hypothyroidism, on Synthroid, check a TSH Depression and insomnia: Ongoing issue, not suicidal ideas, on Effexor  150 mg daily for long time, symptoms not well-controlled. Taper Effexor down, see instructions. Switch to LexaproI likely will need to add Wellbutrin or a second agent later. Follow-up in 4 weeks. Osteopenia: Now with a C1 fracture. Will check a bone density test Vitamin D deficiency: Check levels Generalized aches and pains.. History of PMR. Will check a sedimentation rate and CRP. Pain mngmt: per Dr Ethelene Hal  RTC 4 weeks

## 2017-09-10 NOTE — Addendum Note (Signed)
Addended byConrad  D on: 09/10/2017 02:38 PM   Modules accepted: Orders

## 2017-09-10 NOTE — Telephone Encounter (Signed)
Patient returning call.

## 2017-09-11 ENCOUNTER — Ambulatory Visit (INDEPENDENT_AMBULATORY_CARE_PROVIDER_SITE_OTHER)
Admission: RE | Admit: 2017-09-11 | Discharge: 2017-09-11 | Disposition: A | Discharge status: Home / Self Care | Payer: Medicare Other | Source: Ambulatory Visit | Attending: Internal Medicine | Admitting: Internal Medicine

## 2017-09-11 ENCOUNTER — Other Ambulatory Visit (INDEPENDENT_AMBULATORY_CARE_PROVIDER_SITE_OTHER): Payer: Medicare Other

## 2017-09-11 DIAGNOSIS — M353 Polymyalgia rheumatica: Secondary | ICD-10-CM | POA: Diagnosis not present

## 2017-09-11 DIAGNOSIS — R7982 Elevated C-reactive protein (CRP): Secondary | ICD-10-CM

## 2017-09-11 LAB — CK: CK TOTAL: 56 U/L (ref 7–232)

## 2017-09-13 ENCOUNTER — Other Ambulatory Visit: Payer: Self-pay | Admitting: Internal Medicine

## 2017-09-13 MED ORDER — DIAZEPAM 5 MG PO TABS: 2.5000 mg | ORAL_TABLET | Freq: Three times a day (TID) | ORAL | 0 refills | Status: DC | PRN

## 2017-09-13 NOTE — Telephone Encounter (Signed)
Pt returned call says he is doing much better since taking prednisone. Pt will check pharmacy for script after 3:00 pm per paz prior note it is printed and being faxed.

## 2017-09-13 NOTE — Telephone Encounter (Signed)
Faxed Diazepam Rx/thx dmf

## 2017-09-13 NOTE — Telephone Encounter (Signed)
Please advise 

## 2017-09-13 NOTE — Telephone Encounter (Signed)
Patient voiced that the prednisone has helped a lot & he's doing pretty good. Also, stating that he "finally has some relief."

## 2017-09-13 NOTE — Telephone Encounter (Signed)
Self.  Pt says that he never received medication diazepam Pt says that he never knew to be taking medication. He would like to have it sent in to pharmacy considering that he do not have it.    Please assist further.     Pharmacy: [id number] - [place], [place] - [address]  Pt would like a call back

## 2017-09-13 NOTE — Telephone Encounter (Signed)
Prescription printed, please fax it. Also, call patient, ask how he is doing since he started prednisone for generalized aches and pains. Let me know

## 2017-09-17 ENCOUNTER — Telehealth: Payer: Self-pay

## 2017-09-17 NOTE — Telephone Encounter (Signed)
PA initiated via Covermymeds; KEY: [id number]. Awaiting determination.

## 2017-09-17 NOTE — Telephone Encounter (Signed)
PA approved. Effective from 09/17/2017 through 09/17/2018.

## 2017-09-18 ENCOUNTER — Telehealth: Payer: Self-pay | Admitting: Internal Medicine

## 2017-09-18 NOTE — Telephone Encounter (Signed)
Billie calling in from [facility] would like to make provider aware that the pt's diazepam has been approved for a year. They will send over an approval. Pt has already been notified.

## 2017-09-18 NOTE — Telephone Encounter (Signed)
Noted  

## 2017-09-24 ENCOUNTER — Telehealth: Payer: Self-pay | Admitting: Internal Medicine

## 2017-09-24 NOTE — Telephone Encounter (Signed)
LMOM, started prednisone for presumed PMR, asked for a call back, like to see how he is doing

## 2017-09-24 NOTE — Telephone Encounter (Signed)
thx

## 2017-09-24 NOTE — Telephone Encounter (Signed)
FYI

## 2017-09-24 NOTE — Telephone Encounter (Signed)
Pt returned call wanting to let provider know that he really appreciated Dr Drue Novel calling him today, pt wanted to inform that he has an appt with provider tomorrow.

## 2017-09-25 ENCOUNTER — Ambulatory Visit (INDEPENDENT_AMBULATORY_CARE_PROVIDER_SITE_OTHER): Payer: Medicare Other | Admitting: Internal Medicine

## 2017-09-25 ENCOUNTER — Encounter: Payer: Self-pay | Admitting: Internal Medicine

## 2017-09-25 VITALS — BP 124/68 | HR 69 | Temp 97.4°F | Resp 14 | Ht 63.0 in | Wt 139.2 lb

## 2017-09-25 DIAGNOSIS — M353 Polymyalgia rheumatica: Secondary | ICD-10-CM | POA: Diagnosis not present

## 2017-09-25 LAB — SEDIMENTATION RATE: Sed Rate: 12 mm/hr (ref 0–20)

## 2017-09-25 LAB — HIGH SENSITIVITY CRP: CRP HIGH SENSITIVITY: 4.3 mg/L (ref 0.000–5.000)

## 2017-09-25 MED ORDER — ESCITALOPRAM OXALATE 10 MG PO TABS: 10.0000 mg | ORAL_TABLET | Freq: Every day | ORAL | 3 refills | Status: DC

## 2017-09-25 MED ORDER — DIAZEPAM 5 MG PO TABS: 5.0000 mg | ORAL_TABLET | Freq: Two times a day (BID) | ORAL | 0 refills | Status: DC | PRN

## 2017-09-25 NOTE — Patient Instructions (Addendum)
GO TO THE LAB : Get the blood work     GO TO THE FRONT DESK Schedule your next appointment for a  checkup with me in 4 weeks   Continue the same medications, okay to take diazepam twice a day, watch for excessive sedation

## 2017-09-25 NOTE — Progress Notes (Signed)
Subjective:    Patient ID: Dakota [name], male    DOB: 1933-01-18, 83 y.o.   MRN: [id number]  DOS:  09/25/2017 Type of visit - description : f/u Interval history: PMR: Last CRP was quite elevated, patient is on the prednisone, pain has improved, "25% better". Depression: Medication was adjusted, reports his wife has mental issues by he is under a lot of stress. A small amount of diazepam helps him going  through his day, takes it BID, does not get him excessively sleepy.    Review of Systems Denies headaches, fever chills or a rash.  Past Medical History:  Diagnosis Date  . 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 BPH (benign prostatic hypertrophy)   . CAD (coronary artery disease)    per cath: mild/non obstructive 03/2006  . Chronic neck pain    and HA, dx w/ cervical stenosis  . COPD (chronic obstructive pulmonary disease) (HCC)   . Depression   . Diverticulosis of colon (without mention of hemorrhage)   . Gallbladder & bile duct stone    per [place] 2007  . GERD (gastroesophageal reflux disease)   . Hyperlipidemia   . Hyperplastic colon polyp   . Hypertension   . Lumbar spondylosis    Facet mediated pain, Dr. Ethelene Hal  . Osteopenia   . Peptic ulcer disease   . Pulmonary nodule    s/p Ct x 2 last 05/2008: scarring  . Thyroid disease     Past Surgical History:  Procedure Laterality Date  . ARTHOSCOPIC ROTAOR CUFF REPAIR     L-2003, R-2004 Dr Ranell Patrick  . FACET JOINT INJECTION  2016   Bilateral L4-5 and L5-S1, Dr. Ethelene Hal  . INGUINAL HERNIA REPAIR     r  . SPINAL CORD STIMULATOR INSERTION  5/11   lower back  . ulcer surgery  1978    Social History   Social History  . Marital status: Married    Spouse name: N/A  . Number of children: 2  . Years of education: N/A   Occupational History  . part time     school crossing guard   Social History Main Topics  . Smoking status: Former Games developer  . Smokeless tobacco: Never Used     Comment: 2011 aprox    . Alcohol use 1.2 oz/week    2 Shots of liquor per week     Comment: 2 mixed drinks a week  . Drug use: No  . Sexual activity: No   Other Topics Concern  . Not on file   Social History Narrative   Lives w/ wife, she retired ~02-2016   Daugther is medical PA            Allergies as of 09/25/2017      Reactions   Sulfonamide Derivatives Rash      Medication List       Accurate as of 09/25/17 11:59 PM. Always use your most recent med list.          albuterol 108 (90 Base) MCG/ACT inhaler Commonly known as:  PROAIR HFA Inhale 2 puffs into the lungs every 6 (six) hours as needed for wheezing or shortness of breath.   atorvastatin 20 MG tablet Commonly known as:  LIPITOR Take 1 tablet (20 mg total) by mouth at bedtime.   azelastine 0.1 % nasal spray Commonly known as:  ASTELIN Place 2 sprays into both nostrils at bedtime as needed for rhinitis.  Use in each nostril as directed   CYCLOSET 0.8 MG Tabs Generic drug:  Bromocriptine Mesylate Take 1 tablet (0.8 mg total) by mouth daily.   diazepam 5 MG tablet Commonly known as:  VALIUM Take 1 tablet (5 mg total) by mouth 2 (two) times daily as needed for muscle spasms.   escitalopram 10 MG tablet Commonly known as:  LEXAPRO Take 1 tablet (10 mg total) by mouth daily.   HYDROmorphone 8 MG tablet Commonly known as:  DILAUDID Take 8 mg by mouth every 4 (four) hours as needed for moderate pain.   levothyroxine 75 MCG tablet Commonly known as:  SYNTHROID, LEVOTHROID Take 1 tablet (75 mcg total) by mouth daily before breakfast.   mometasone-formoterol 100-5 MCG/ACT Aero Commonly known as:  DULERA Inhale 2 puffs into the lungs 2 (two) times daily.   NASACORT ALLERGY 24HR 55 MCG/ACT Aero nasal inhaler Generic drug:  triamcinolone Place 2 sprays into the nose daily.   NEXIUM PO Take by mouth. OTC , takes one every AM   predniSONE 5 MG tablet Commonly known as:  DELTASONE Take 3 tablets (15 mg total) by mouth daily  with breakfast.   tiotropium 18 MCG inhalation capsule Commonly known as:  SPIRIVA HANDIHALER Place 1 capsule (18 mcg total) into inhaler and inhale daily.   zolpidem 5 MG tablet Commonly known as:  AMBIEN Take 1 tablet (5 mg total) by mouth at bedtime as needed for sleep.          Objective:   Physical Exam BP 124/68 (BP Location: Left Arm, Patient Position: Sitting, Cuff Size: Small)   Pulse 69   Temp (!) 97.4 F (36.3 C) (Oral)   Resp 14   Ht  (1.6 m)   Wt 139 lb 4 oz (63.2 kg)   SpO2 97%   BMI 24.67 kg/m  General:   Well developed, well nourished . NAD.  HEENT:  Normocephalic . Face symmetric, atraumatic Lungs:  CTA B Normal respiratory effort, no intercostal retractions, no accessory muscle use. Heart: RRR,  no murmur.  No pretibial edema bilaterally  Skin: Not pale. Not jaundice Neurologic:  alert & oriented X3.  Speech normal, gait appropriate for age and unassisted Psych--  He seemed more calm today. Looks much better emotionally      Assessment & Plan:  Assessment: Prediabetes Hyperlipidemia Hypothyroidism Hypogonadism-- not on HRT, on bromocriptine  Depression, insomnia (effexor, ambien; xanax dc 12-2015,not using them) COPD: As of 07-2016, previous pulmonary referral and PFTs failed. GI:   ---GERD, PUD, diverticulosis, colon polyps ---Elevated LFTs Osteopenia CV: Aneurysm, left ICA 2 mm 2009, saw neurology Mild non-obstructive CAD per cath 2007 MSK: ---Pain med rx by Dr Ethelene Hal --Lumbar spondylosis --Spinal cord stimulation 2011 --Chronic neck pain and headache, DX cervical stenosis --C1 close fracture 02-2017, conservative management per neurosurgery H/o Weight loss, fatigue, sweats: On and off symptoms since at least 2013, see previous entries  Palpable aorta, ultrasound (-) H/o vit d def ?PMR 2013  PLAN PMR: At the last visit, he has generalized aches and pains, sedimentation rate was normal but high  sensitive CRP was elevated at  19.7. He was started on prednisone 15 mg qd , (case d/w rheumatology, they will see him),pain is 25% better, it is a good response if I keep in mind that much of the pain is related to DJD. Plan: Sedimentation rate, high sensitivity CRP, continue prednisone at same dose, see rheumatology is planned in few days Depression and insomnia: Today patient  reports sxs are  mostly related to wife's health (mental issues per patient).  See last OV: Effexor was tapered, now on Lexapro, not feeling much better, will need to give Lexapro more time to work. Also requests diazepam which helped significantly, does not get too sedated. Prescription provided. RTC 4 weeks

## 2017-09-25 NOTE — Progress Notes (Signed)
Pre visit review using our clinic review tool, if applicable. No additional management support is needed unless otherwise documented below in the visit note. 

## 2017-09-26 NOTE — Assessment & Plan Note (Signed)
PMR: At the last visit, he has generalized aches and pains, sedimentation rate was normal but high  sensitive CRP was elevated at 19.7. He was started on prednisone 15 mg qd , (case d/w rheumatology, they will see him),pain is 25% better, it is a good response if I keep in mind that much of the pain is related to DJD. Plan: Sedimentation rate, high sensitivity CRP, continue prednisone at same dose, see rheumatology is planned in few days Depression and insomnia: Today patient reports sxs are  mostly related to wife's health (mental issues per patient).  See last OV: Effexor was tapered, now on Lexapro, not feeling much better, will need to give Lexapro more time to work. Also requests diazepam which helped significantly, does not get too sedated. Prescription provided. RTC 4 weeks

## 2017-10-04 ENCOUNTER — Ambulatory Visit: Payer: Medicare Other | Admitting: Internal Medicine

## 2017-10-12 ENCOUNTER — Other Ambulatory Visit: Payer: Self-pay | Admitting: Internal Medicine

## 2017-10-15 ENCOUNTER — Telehealth: Payer: Self-pay | Admitting: Internal Medicine

## 2017-10-15 ENCOUNTER — Other Ambulatory Visit: Payer: Self-pay

## 2017-10-15 MED ORDER — CYCLOSET 0.8 MG PO TABS: 0.8000 mg | ORAL_TABLET | Freq: Every day | ORAL | 6 refills | Status: DC

## 2017-10-15 NOTE — Telephone Encounter (Signed)
Pt is requesting refill on Ambien 5mg .  Last OV: 09/25/2017 Last Fill: 06/15/2017 #30 and 3RF UDS: Not needed for Ambien per PCP  Pt also on diazepam for muscle spasms?    Please advise.

## 2017-10-15 NOTE — Telephone Encounter (Signed)
Okay Ambien No. 30,  no refills. Also, we refer him to rheumatology due to PMR, he has cancel the appointments a few times.  Please advise patient is extremely important he follows up with rheumatology in regards to PMR.

## 2017-10-16 NOTE — Telephone Encounter (Signed)
Rx faxed to [facility] pharmacy.  

## 2017-10-16 NOTE — Telephone Encounter (Signed)
Tried calling Pt, no answer, he has 4 week f/u scheduled w/ PCP on 10/24/2017.

## 2017-10-16 NOTE — Telephone Encounter (Signed)
Rx printed, awaiting MD signature.  

## 2017-10-16 NOTE — Telephone Encounter (Signed)
Noted, thx.

## 2017-10-17 ENCOUNTER — Other Ambulatory Visit: Payer: Self-pay | Admitting: Internal Medicine

## 2017-10-17 NOTE — Telephone Encounter (Signed)
Patient states pharmacy will not refill zolpidem (AMBIEN) 5 MG tablet ned due to patient being to early, confirmed with patient requesting Rx now to help with sleeping, please advise   [id number] - [place] - [address] [facility] [phone] (Phone) [phone] (Fax)

## 2017-10-17 NOTE — Telephone Encounter (Signed)
Spoke w/ [facility], gave okay to fill several days early, however insurance will not pay yet. Pt will have to pay out of pocket if needing this early.

## 2017-10-24 ENCOUNTER — Ambulatory Visit: Payer: Medicare Other | Admitting: Internal Medicine

## 2017-10-25 ENCOUNTER — Ambulatory Visit: Payer: Medicare Other | Admitting: Internal Medicine

## 2017-10-26 ENCOUNTER — Other Ambulatory Visit: Payer: Self-pay | Admitting: Internal Medicine

## 2017-10-26 NOTE — Telephone Encounter (Signed)
Pt is requesting refill on diazepam 5mg .  Last OV: 09/25/2017 Last Fill: 09/25/2017 #60 and 0RF UDS: 2014 Low risk  Please adivse.

## 2017-10-28 NOTE — Telephone Encounter (Signed)
Ok 60, no RF  Ask pt to pick up Rx and get a UDS

## 2017-10-29 NOTE — Telephone Encounter (Signed)
Per PCP okay to wait until 11/01/2017 for UDS. [facility]

## 2017-10-29 NOTE — Telephone Encounter (Signed)
Rx printed, awaiting MD signature.  

## 2017-11-01 ENCOUNTER — Telehealth: Payer: Self-pay | Admitting: Internal Medicine

## 2017-11-01 ENCOUNTER — Ambulatory Visit: Payer: Medicare Other | Admitting: Internal Medicine

## 2017-11-01 NOTE — Telephone Encounter (Signed)
Noted, no charge.  

## 2017-11-01 NOTE — Telephone Encounter (Signed)
Pt called in to cancel apt. He said that he dont feel like coming in today. Pt says that he will call back to reschedule.

## 2017-11-02 ENCOUNTER — Telehealth: Payer: Self-pay

## 2017-11-02 NOTE — Telephone Encounter (Signed)
PA initiated via Covermymeds; KEY: [id number]. Awaiting determination.

## 2017-11-05 ENCOUNTER — Telehealth: Payer: Self-pay | Admitting: Internal Medicine

## 2017-11-05 NOTE — Telephone Encounter (Signed)
FYI -[employer] called to inform cycloset approved for 1 year- they are sending documentation in the mail.

## 2017-11-05 NOTE — Telephone Encounter (Signed)
Noted  

## 2017-11-05 NOTE — Telephone Encounter (Signed)
FYI -[employer] called to inform cycloset approved for 1 year- they are sending documentation in the mail.  

## 2017-11-22 ENCOUNTER — Other Ambulatory Visit: Payer: Self-pay | Admitting: Internal Medicine

## 2017-11-23 ENCOUNTER — Telehealth: Payer: Self-pay | Admitting: Internal Medicine

## 2017-11-23 NOTE — Telephone Encounter (Signed)
Thanks Kaylyn hard for me to get to messages timely when I do [place].

## 2017-11-23 NOTE — Telephone Encounter (Signed)
Spoke w/ [facility]- informed that escitalopram (Lexapro) is only ~3.50 on insurance- requested that they get medication ready for Pt. Spoke w/ Pt, informed of above. Pt verbalized understanding.

## 2017-11-23 NOTE — Telephone Encounter (Signed)
thx

## 2017-11-23 NOTE — Telephone Encounter (Signed)
Copied from CRM [id number]. Topic: Quick Communication - See Telephone Encounter >> Nov 23, 2017  2:18 PM Herby AbrahamJohnson, Shiquita C wrote: CRM for notification. See Telephone encounter for: pt called in because he says that medication Lexapro is to expensive pt says that medication co-pay is 400.00 pt says that he can not afford. Pt would like to know if provider could prescribe him something different?   Please assist further.   11/23/17.

## 2017-11-23 NOTE — Telephone Encounter (Signed)
Please advise 

## 2017-11-23 NOTE — Telephone Encounter (Signed)
Please call the pharmacist.  He does not need branded  Lexapro, perfectly fine to use escitalopram, the generic.  If that is still $400 then switch him to citalopram 20 mg 1 tablet daily.

## 2017-11-27 ENCOUNTER — Telehealth: Payer: Self-pay | Admitting: Internal Medicine

## 2017-11-27 NOTE — Telephone Encounter (Signed)
Copied from CRM [id number]. Topic: Quick Communication - See Telephone Encounter >> Nov 27, 2017 12:55 PM Oneal GroutSebastian, Jennifer S wrote: CRM for notification. See Telephone encounter for:  Requesting refill on [employer] [facility] [address] 11/27/17.

## 2017-11-28 NOTE — Telephone Encounter (Signed)
Received refill request for Ambien- however Pt overdue for 4 week follow-up (last visit 09/25/2017). Please inform Pt that we will need to see him in office before refills can be sent. Thank you.

## 2017-11-28 NOTE — Telephone Encounter (Signed)
Medication needs approval. Last OV 09/25/17. Thanks.

## 2017-11-29 ENCOUNTER — Other Ambulatory Visit: Payer: Self-pay | Admitting: Internal Medicine

## 2017-11-30 ENCOUNTER — Encounter: Payer: Self-pay | Admitting: Internal Medicine

## 2017-11-30 ENCOUNTER — Ambulatory Visit (INDEPENDENT_AMBULATORY_CARE_PROVIDER_SITE_OTHER): Payer: Medicare Other | Admitting: Internal Medicine

## 2017-11-30 VITALS — BP 128/74 | HR 74 | Temp 97.8°F | Resp 14 | Ht 63.0 in | Wt 138.2 lb

## 2017-11-30 DIAGNOSIS — M159 Polyosteoarthritis, unspecified: Secondary | ICD-10-CM

## 2017-11-30 DIAGNOSIS — M150 Primary generalized (osteo)arthritis: Secondary | ICD-10-CM

## 2017-11-30 DIAGNOSIS — M353 Polymyalgia rheumatica: Secondary | ICD-10-CM

## 2017-11-30 DIAGNOSIS — F418 Other specified anxiety disorders: Secondary | ICD-10-CM | POA: Diagnosis not present

## 2017-11-30 MED ORDER — LEVOTHYROXINE SODIUM 75 MCG PO TABS: 75.0000 ug | ORAL_TABLET | Freq: Every day | ORAL | 5 refills | Status: DC

## 2017-11-30 MED ORDER — ESCITALOPRAM OXALATE 10 MG PO TABS: 15.0000 mg | ORAL_TABLET | Freq: Every day | ORAL | 1 refills | Status: AC

## 2017-11-30 MED ORDER — ALBUTEROL SULFATE HFA 108 (90 BASE) MCG/ACT IN AERS: 2.0000 | INHALATION_SPRAY | Freq: Four times a day (QID) | RESPIRATORY_TRACT | 5 refills | Status: AC | PRN

## 2017-11-30 MED ORDER — MOMETASONE FURO-FORMOTEROL FUM 100-5 MCG/ACT IN AERO: 2.0000 | INHALATION_SPRAY | Freq: Two times a day (BID) | RESPIRATORY_TRACT | 5 refills | Status: AC

## 2017-11-30 MED ORDER — ATORVASTATIN CALCIUM 20 MG PO TABS: 20.0000 mg | ORAL_TABLET | Freq: Every day | ORAL | 5 refills | Status: AC

## 2017-11-30 MED ORDER — DIAZEPAM 5 MG PO TABS: 5.0000 mg | ORAL_TABLET | Freq: Two times a day (BID) | ORAL | 0 refills | Status: AC | PRN

## 2017-11-30 MED ORDER — TIOTROPIUM BROMIDE MONOHYDRATE 18 MCG IN CAPS: 18.0000 ug | ORAL_CAPSULE | Freq: Every day | RESPIRATORY_TRACT | 5 refills | Status: AC

## 2017-11-30 MED ORDER — ZOLPIDEM TARTRATE 5 MG PO TABS: 5.0000 mg | ORAL_TABLET | Freq: Every evening | ORAL | 1 refills | Status: AC | PRN

## 2017-11-30 NOTE — Progress Notes (Signed)
Pre visit review using our clinic review tool, if applicable. No additional management support is needed unless otherwise documented below in the visit note. 

## 2017-11-30 NOTE — Patient Instructions (Addendum)
  GO TO THE FRONT DESK Schedule your next appointment for a checkup in 6 weeks  Lexapro (escitalopram) 10 mg: Take 1.5 tablets daily  Decrease prednisone to 5 mg tablets: two a day until you see Dr. Clarita CraneBeckmann   See Dr. Ethelene Halamos as soon as possible

## 2017-11-30 NOTE — Progress Notes (Signed)
Subjective:    Patient ID: Dakota [name], male    DOB: 1933-09-30, 83 y.o.   MRN: [id number]  DOS:  11/30/2017 Type of visit - description : f/u Interval history: PMR: On steroids, overall symptoms have decreased.  Much less achy.  To see Dr. Clarita Crane next week. Depression: On Lexapro 10 mg, states he still has a lot of stress, reports that the relation w/ his wife is "going downhill". Insomnia: Needs refills.  He run out of meds and was able to sleep only 2 hours last night Reports his balance has gotten slightly worse, he is having shakiness and cramps in his hands however  neck pain is much improved compared to previous months COPD : On inhalers, symptoms controlled.  Wt Readings from Last 3 Encounters:  11/30/17 138 lb 4 oz (62.7 kg)  09/25/17 139 lb 4 oz (63.2 kg)  09/06/17 135 lb 2 oz (61.3 kg)     Review of Systems Denies fever chills No suicidal ideas Still has occasional headaches but denies amaurosis fugax.  Patient is poor and at baseline. No recent falls  Past Medical History:  Diagnosis Date  . 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 BPH (benign prostatic hypertrophy)   . CAD (coronary artery disease)    per cath: mild/non obstructive 03/2006  . Chronic neck pain    and HA, dx w/ cervical stenosis  . COPD (chronic obstructive pulmonary disease) (HCC)   . Depression   . Diverticulosis of colon (without mention of hemorrhage)   . Gallbladder & bile duct stone    per [place] 2007  . GERD (gastroesophageal reflux disease)   . Hyperlipidemia   . Hyperplastic colon polyp   . Hypertension   . Lumbar spondylosis    Facet mediated pain, Dr. Ethelene Hal  . Osteopenia   . Peptic ulcer disease   . Pulmonary nodule    s/p Ct x 2 last 05/2008: scarring  . Thyroid disease     Past Surgical History:  Procedure Laterality Date  . ARTHOSCOPIC ROTAOR CUFF REPAIR     L-2003, R-2004 Dr Ranell Patrick  . FACET JOINT INJECTION  2016   Bilateral L4-5 and L5-S1, Dr.  Ethelene Hal  . INGUINAL HERNIA REPAIR     r  . SPINAL CORD STIMULATOR INSERTION  5/11   lower back  . ulcer surgery  1978    Social History   Socioeconomic History  . Marital status: Married    Spouse name: Not on file  . Number of children: 2  . Years of education: Not on file  . Highest education level: Not on file  Social Needs  . Financial resource strain: Not on file  . Food insecurity - worry: Not on file  . Food insecurity - inability: Not on file  . Transportation needs - medical: Not on file  . Transportation needs - non-medical: Not on file  Occupational History  . Occupation: part time    Comment: school crossing guard  Tobacco Use  . Smoking status: Former Games developer  . Smokeless tobacco: Never Used  . Tobacco comment: 2011 aprox  Substance and Sexual Activity  . Alcohol use: Yes    Alcohol/week: 1.2 oz    Types: 2 Shots of liquor per week    Comment: 2 mixed drinks a week  . Drug use: No  . Sexual activity: No  Other Topics Concern  . Not on file  Social History Narrative  Lives w/ wife, she retired ~02-2016   Daugther is medical PA         Allergies as of 11/30/2017      Reactions   Sulfonamide Derivatives Rash      Medication List        Accurate as of 11/30/17 11:59 PM. Always use your most recent med list.          albuterol 108 (90 Base) MCG/ACT inhaler Commonly known as:  PROAIR HFA Inhale 2 puffs into the lungs every 6 (six) hours as needed for wheezing or shortness of breath.   atorvastatin 20 MG tablet Commonly known as:  LIPITOR Take 1 tablet (20 mg total) by mouth at bedtime.   azelastine 0.1 % nasal spray Commonly known as:  ASTELIN Place 2 sprays into both nostrils at bedtime as needed for rhinitis. Use in each nostril as directed   CYCLOSET 0.8 MG Tabs Generic drug:  Bromocriptine Mesylate Take 1 tablet (0.8 mg total) by mouth daily.   diazepam 5 MG tablet Commonly known as:  VALIUM Take 1 tablet (5 mg total) by mouth 2  (two) times daily as needed for muscle spasms.   escitalopram 10 MG tablet Commonly known as:  LEXAPRO Take 1.5 tablets (15 mg total) by mouth daily.   HYDROmorphone 8 MG tablet Commonly known as:  DILAUDID Take 8 mg by mouth every 4 (four) hours as needed for moderate pain.   levothyroxine 75 MCG tablet Commonly known as:  SYNTHROID, LEVOTHROID Take 1 tablet (75 mcg total) by mouth daily before breakfast.   mometasone-formoterol 100-5 MCG/ACT Aero Commonly known as:  DULERA Inhale 2 puffs into the lungs 2 (two) times daily.   NASACORT ALLERGY 24HR 55 MCG/ACT Aero nasal inhaler Generic drug:  triamcinolone Place 2 sprays into the nose daily.   NEXIUM PO Take by mouth. OTC , takes one every AM   predniSONE 5 MG tablet Commonly known as:  DELTASONE Take 10 mg by mouth daily with breakfast.   tiotropium 18 MCG inhalation capsule Commonly known as:  SPIRIVA HANDIHALER Place 1 capsule (18 mcg total) into inhaler and inhale daily.   zolpidem 5 MG tablet Commonly known as:  AMBIEN Take 1 tablet (5 mg total) by mouth at bedtime as needed for sleep.          Objective:   Physical Exam BP 128/74 (BP Location: Right Arm, Patient Position: Sitting, Cuff Size: Small)   Pulse 74   Temp 97.8 F (36.6 C) (Oral)   Resp 14   Ht 5\' 3"  (1.6 m)   Wt 138 lb 4 oz (62.7 kg)   SpO2 96%   BMI 24.49 kg/m  General:   Well developed, well nourished. NAD.  He however seems to be more frail today, more difficulty moving around. HEENT:  Normocephalic . Face symmetric, atraumatic Lungs:  CTA B Normal respiratory effort, no intercostal retractions, no accessory muscle use. Heart: RRR,  no murmur.  No pretibial edema bilaterally  Skin: Not pale. Not jaundice Neurologic:  alert & oriented X3.  Speech normal, gait assisted by a cane. Motor symmetric. DTRs upper extremities symmetric, no hyperreflexia.  Lower extremities: Decrease ankle jerks symmetrically. + Tremor hands, mild.  Some  tremor of the chin. Psych--   Seems calm today, he seems frustrated when talks about the relationship with his wife.    Assessment & Plan:    Assessment: Prediabetes Hyperlipidemia Hypothyroidism Hypogonadism-- not on HRT, on bromocriptine  Depression, insomnia (effexor, ambien; xanax  dc 12-2015,not using them) COPD: As of 07-2016, previous pulmonary referral and PFTs failed. GI:   ---GERD, PUD, diverticulosis, colon polyps ---Elevated LFTs Osteopenia CV: Aneurysm, left ICA 2 mm 2009, saw neurology Mild non-obstructive CAD per cath 2007 MSK: ---Pain med rx by Dr Ethelene Halamos --Lumbar spondylosis --Spinal cord stimulation 2011 --Chronic neck pain and headache, DX cervical stenosis --C1 close fracture 02-2017, conservative management per neurosurgery H/o Weight loss, fatigue, sweats: On and off symptoms since at least 2013, see previous entries  Palpable aorta, ultrasound (-) H/o vit d def ?PMR 2013  PLAN PMR: Overall feels better, generalized arthralgias, myalgias overall improved.  On prednisone 15 mg, plan: decrease to 10 mg, to see rheumatology 12/05/2017 according to the patient. Depression,  insomnia: Trigger is mostly his relationship with his wife, the patient reports she has mental issues, encouraged him to seek counseling ; his wife refuses to see a MD.  Currently on Lexapro 10 mg, increased to 15 mg.  Refill Valium and Ambien.  Denies suicidal ideas DJD: DJD severe w/  h/o cervical stenosis: although neck pain is better, he reports more upper extremity cramps and feeling stiff, related to spinal stenosis?  No hyperreflexia on exam nevertheless rec to see Dr. Ethelene Halamos asap RTC 6 weeks  Today, I spent more than  30  min with the patient: >50% of the time counseling regards anxiety, depression, insomnia.  Listening to his concerns regards his wife's health.

## 2017-12-01 NOTE — Assessment & Plan Note (Signed)
PLAN PMR: Overall feels better, generalized arthralgias, myalgias overall improved.  On prednisone 15 mg, plan: decrease to 10 mg, to see rheumatology 12/05/2017 according to the patient. Depression,  insomnia: Trigger is mostly his relationship with his wife, the patient reports she has mental issues, encouraged him to seek counseling ; his wife refuses to see a MD.  Currently on Lexapro 10 mg, increased to 15 mg.  Refill Valium and Ambien.  Denies suicidal ideas DJD: DJD severe w/  h/o cervical stenosis: although neck pain is better, he reports more upper extremity cramps and feeling stiff, related to spinal stenosis?  No hyperreflexia on exam nevertheless rec to see Dr. Ethelene Halamos asap RTC 6 weeks

## 2017-12-06 ENCOUNTER — Telehealth: Payer: Self-pay

## 2017-12-06 NOTE — Telephone Encounter (Signed)
Copied from CRM [phone]. Topic: Quick Communication - See Telephone Encounter >> Dec 06, 2017  2:02 PM Oneal GroutSebastian, Jennifer S wrote: CRM for notification. See Telephone encounter for:  [facility] Need to speak with someone to verify meds, 12/06/17.

## 2017-12-06 NOTE — Telephone Encounter (Signed)
Spoke w/ [employer]- went over medications with them.

## 2017-12-07 ENCOUNTER — Other Ambulatory Visit: Payer: Self-pay | Admitting: Internal Medicine

## 2017-12-07 MED ORDER — LEVOTHYROXINE SODIUM 75 MCG PO TABS: 75.0000 ug | ORAL_TABLET | Freq: Every day | ORAL | 5 refills | Status: AC

## 2017-12-07 MED ORDER — CYCLOSET 0.8 MG PO TABS: 0.8000 mg | ORAL_TABLET | Freq: Every day | ORAL | 5 refills | Status: AC

## 2017-12-07 NOTE — Telephone Encounter (Signed)
Copied from CRM [phone]. Topic: Quick Communication - Rx Refill/Question >> Dec 07, 2017  1:17 PM Oneal GroutSebastian, Jennifer S wrote: Has the patient contacted their pharmacy? No. Unable to reach them   (Agent: If no, request that the patient contact the pharmacy for the refill.)   Preferred Pharmacy (with phone number or street name): [facility] on [address]   Agent: Please be advised that RX refills may take up to 3 business days. We ask that you follow-up with your pharmacy. Requesting refill on levothyroxine (SYNTHROID, LEVOTHROID) 75 MCG tablet and CYCLOSET 0.8 MG TABS and

## 2017-12-07 NOTE — Telephone Encounter (Signed)
Refill request for synthorid and cycloset / LOV 11/30/17 with Dr. Drue NovelPaz / Synthroid refilled, please refill Cycloset per patient request

## 2017-12-07 NOTE — Telephone Encounter (Signed)
Rxs sent

## 2017-12-12 ENCOUNTER — Other Ambulatory Visit: Payer: Self-pay | Admitting: Internal Medicine

## 2017-12-12 NOTE — Telephone Encounter (Signed)
Tell pt: Needs to see rheumatology report, he was supposed to see them a few days ago, please get ot . Okay to refill number 21 tablets, further refills after I reviewed the rheumatology note.

## 2017-12-12 NOTE — Telephone Encounter (Signed)
Pt is requesting refill on prednisone 5mg . Please advise.

## 2017-12-13 ENCOUNTER — Other Ambulatory Visit: Payer: Self-pay

## 2017-12-13 MED ORDER — AZELASTINE HCL 0.1 % NA SOLN: 2.0000 | Freq: Every evening | NASAL | 6 refills | Status: AC | PRN

## 2017-12-14 ENCOUNTER — Telehealth: Payer: Self-pay | Admitting: Internal Medicine

## 2017-12-14 NOTE — Telephone Encounter (Signed)
Copied from CRM #[id number]. Topic: Quick Communication - See Telephone Encounter >> Dec 14, 2017  1:34 PM Arlyss Gandyichardson, Brennin Durfee N, NT wrote: CRM for notification. See Telephone encounter for: [facility] [address] calling to get a refill for this pt for  HYDROmorphone (DILAUDID).  12/14/17.

## 2017-12-14 NOTE — Telephone Encounter (Signed)
Spoke w/ Dakota [name]- informed that Dr. Sheran Luzichard Ramos would need to prescribe or refill the Dilaudid- telephone number given for their office.

## 2017-12-22 ENCOUNTER — Emergency Department (HOSPITAL_COMMUNITY): Payer: Medicare Other

## 2017-12-22 ENCOUNTER — Encounter (HOSPITAL_COMMUNITY): Payer: Self-pay | Admitting: Emergency Medicine

## 2017-12-22 ENCOUNTER — Emergency Department (HOSPITAL_COMMUNITY)
Admission: EM | Admit: 2017-12-22 | Discharge: 2018-01-18 | Disposition: E | Payer: Medicare Other | Attending: Emergency Medicine | Admitting: Emergency Medicine

## 2017-12-22 DIAGNOSIS — E039 Hypothyroidism, unspecified: Secondary | ICD-10-CM | POA: Diagnosis not present

## 2017-12-22 DIAGNOSIS — I2510 Atherosclerotic heart disease of native coronary artery without angina pectoris: Secondary | ICD-10-CM | POA: Diagnosis not present

## 2017-12-22 DIAGNOSIS — I10 Essential (primary) hypertension: Secondary | ICD-10-CM | POA: Insufficient documentation

## 2017-12-22 DIAGNOSIS — Z79899 Other long term (current) drug therapy: Secondary | ICD-10-CM | POA: Diagnosis not present

## 2017-12-22 DIAGNOSIS — I469 Cardiac arrest, cause unspecified: Secondary | ICD-10-CM | POA: Diagnosis not present

## 2017-12-22 DIAGNOSIS — E872 Acidosis, unspecified: Secondary | ICD-10-CM

## 2017-12-22 DIAGNOSIS — Z882 Allergy status to sulfonamides status: Secondary | ICD-10-CM | POA: Insufficient documentation

## 2017-12-22 DIAGNOSIS — J449 Chronic obstructive pulmonary disease, unspecified: Secondary | ICD-10-CM | POA: Diagnosis not present

## 2017-12-22 DIAGNOSIS — Z85828 Personal history of other malignant neoplasm of skin: Secondary | ICD-10-CM | POA: Insufficient documentation

## 2017-12-22 DIAGNOSIS — Z87891 Personal history of nicotine dependence: Secondary | ICD-10-CM | POA: Insufficient documentation

## 2017-12-22 DIAGNOSIS — E875 Hyperkalemia: Secondary | ICD-10-CM | POA: Diagnosis not present

## 2017-12-22 LAB — CBC WITH DIFFERENTIAL/PLATELET
BASOS ABS: 0 10*3/uL (ref 0.0–0.1)
BLASTS: 0 %
Band Neutrophils: 48 %
Basophils Relative: 0 %
EOS ABS: 0.1 10*3/uL (ref 0.0–0.7)
Eosinophils Relative: 1 %
HEMATOCRIT: 34.6 % — AB (ref 39.0–52.0)
HEMOGLOBIN: 10 g/dL — AB (ref 13.0–17.0)
LYMPHS PCT: 19 %
Lymphs Abs: 1.4 10*3/uL (ref 0.7–4.0)
MCH: 31.3 pg (ref 26.0–34.0)
MCHC: 28.9 g/dL — AB (ref 30.0–36.0)
MCV: 108.1 fL — ABNORMAL HIGH (ref 78.0–100.0)
MONO ABS: 0.4 10*3/uL (ref 0.1–1.0)
MYELOCYTES: 3 %
Metamyelocytes Relative: 9 %
Monocytes Relative: 5 %
Neutro Abs: 5.6 10*3/uL (ref 1.7–7.7)
Neutrophils Relative %: 15 %
OTHER: 0 %
PROMYELOCYTES ABS: 0 %
Platelets: 43 10*3/uL — ABNORMAL LOW (ref 150–400)
RBC: 3.2 MIL/uL — ABNORMAL LOW (ref 4.22–5.81)
RDW: 15 % (ref 11.5–15.5)
WBC: 7.5 10*3/uL (ref 4.0–10.5)
nRBC: 0 /100 WBC

## 2017-12-22 LAB — COMPREHENSIVE METABOLIC PANEL
ALK PHOS: 98 U/L (ref 38–126)
ALT: 6410 U/L — AB (ref 17–63)
Albumin: 1.8 g/dL — ABNORMAL LOW (ref 3.5–5.0)
BUN: 48 mg/dL — ABNORMAL HIGH (ref 6–20)
CALCIUM: 7.4 mg/dL — AB (ref 8.9–10.3)
CO2: <7 mmol/L — ABNORMAL LOW (ref 22–32)
CREATININE: 4.64 mg/dL — AB (ref 0.61–1.24)
Chloride: 108 mmol/L (ref 101–111)
GFR, EST AFRICAN AMERICAN: 12 mL/min — AB (ref >60)
GFR, EST NON AFRICAN AMERICAN: 11 mL/min — AB (ref >60)
GLUCOSE: 128 mg/dL — AB (ref 65–99)
Potassium: >7.5 mmol/L (ref 3.5–5.1)
SODIUM: 139 mmol/L (ref 135–145)
TOTAL PROTEIN: 3.7 g/dL — AB (ref 6.5–8.1)
Total Bilirubin: 1.3 mg/dL — ABNORMAL HIGH (ref 0.3–1.2)

## 2017-12-22 LAB — APTT: APTT: 56 s — AB (ref 24–36)

## 2017-12-22 LAB — I-STAT CG4 LACTIC ACID, ED: LACTIC ACID, VENOUS: 15.91 mmol/L — AB (ref 0.5–1.9)

## 2017-12-22 LAB — I-STAT CHEM 8, ED
BUN: 70 mg/dL — ABNORMAL HIGH (ref 6–20)
Calcium, Ion: 0.93 mmol/L — ABNORMAL LOW (ref 1.15–1.40)
Chloride: 114 mmol/L — ABNORMAL HIGH (ref 101–111)
Creatinine, Ser: 4 mg/dL — ABNORMAL HIGH (ref 0.61–1.24)
Glucose, Bld: 114 mg/dL — ABNORMAL HIGH (ref 65–99)
HEMATOCRIT: 29 % — AB (ref 39.0–52.0)
HEMOGLOBIN: 9.9 g/dL — AB (ref 13.0–17.0)
POTASSIUM: 8.2 mmol/L — AB (ref 3.5–5.1)
SODIUM: 137 mmol/L (ref 135–145)
TCO2: 10 mmol/L — ABNORMAL LOW (ref 22–32)

## 2017-12-22 LAB — PROTIME-INR
INR: 3.75
PROTHROMBIN TIME: 36.8 s — AB (ref 11.4–15.2)

## 2017-12-22 LAB — I-STAT TROPONIN, ED: TROPONIN I, POC: 2.27 ng/mL — AB (ref 0.00–0.08)

## 2017-12-22 MED ORDER — SODIUM BICARBONATE 8.4 % IV SOLN
50.0000 meq | Freq: Once | INTRAVENOUS | Status: AC
  Administered 2017-12-22: 50 meq via INTRAVENOUS

## 2017-12-22 MED ORDER — EPINEPHRINE PF 1 MG/10ML IJ SOSY
PREFILLED_SYRINGE | INTRAMUSCULAR | Status: AC | PRN
  Administered 2017-12-22: 1 mg via INTRAVENOUS

## 2017-12-22 MED ORDER — SODIUM CHLORIDE 0.9 % IV BOLUS (SEPSIS)
1000.0000 mL | Freq: Once | INTRAVENOUS | Status: AC
  Administered 2017-12-22: 1000 mL via INTRAVENOUS

## 2017-12-22 MED ORDER — EPINEPHRINE PF 1 MG/ML IJ SOLN
0.5000 ug/min | INTRAVENOUS | Status: DC
  Filled 2017-12-22: qty 4

## 2017-12-22 MED ORDER — SODIUM CHLORIDE 0.9 % IV SOLN
1.0000 g | INTRAVENOUS | Status: DC
  Filled 2017-12-22: qty 10

## 2017-12-22 MED ORDER — EPINEPHRINE PF 1 MG/10ML IJ SOSY
PREFILLED_SYRINGE | INTRAMUSCULAR | Status: DC | PRN
  Administered 2017-12-22: 1 mg via INTRAVENOUS
  Administered 2017-12-22: 0.5 mg via INTRAVENOUS

## 2017-12-22 MED ORDER — INSULIN ASPART 100 UNIT/ML ~~LOC~~ SOLN
SUBCUTANEOUS | Status: AC
  Filled 2017-12-22: qty 1

## 2017-12-22 MED ORDER — SODIUM CHLORIDE 0.9 % IV SOLN
INTRAVENOUS | Status: AC | PRN
  Administered 2017-12-22: 500 mL via INTRAVENOUS

## 2017-12-22 MED ORDER — ALBUTEROL SULFATE (2.5 MG/3ML) 0.083% IN NEBU: 10.0000 mg | INHALATION_SOLUTION | Freq: Once | RESPIRATORY_TRACT | Status: DC

## 2017-12-22 MED ORDER — INSULIN ASPART 100 UNIT/ML IV SOLN
10.0000 [IU] | INTRAVENOUS | Status: AC
  Administered 2017-12-22: 10 [IU] via INTRAVENOUS
  Filled 2017-12-22: qty 0.1

## 2017-12-22 MED ORDER — DEXTROSE 50 % IV SOLN
1.0000 | Freq: Once | INTRAVENOUS | Status: AC
  Administered 2017-12-22: 50 mL via INTRAVENOUS

## 2017-12-22 MED ORDER — NOREPINEPHRINE BITARTRATE 1 MG/ML IV SOLN
0.0000 ug/min | Freq: Once | INTRAVENOUS | Status: AC
  Administered 2017-12-22: 10 ug/min via INTRAVENOUS
  Filled 2017-12-22: qty 4

## 2017-12-22 MED ORDER — CALCIUM CHLORIDE 10 % IV SOLN
INTRAVENOUS | Status: AC | PRN
  Administered 2017-12-22: 1 g via INTRAVENOUS

## 2017-12-22 MED ORDER — SODIUM CHLORIDE 0.9 % IV BOLUS (SEPSIS): 500.0000 mL | Freq: Once | INTRAVENOUS | Status: DC

## 2017-12-23 LAB — BLOOD CULTURE ID PANEL (REFLEXED)
Acinetobacter baumannii: NOT DETECTED
CANDIDA ALBICANS: NOT DETECTED
CANDIDA GLABRATA: NOT DETECTED
CANDIDA TROPICALIS: NOT DETECTED
Candida krusei: NOT DETECTED
Candida parapsilosis: NOT DETECTED
ENTEROBACTERIACEAE SPECIES: NOT DETECTED
Enterobacter cloacae complex: NOT DETECTED
Enterococcus species: NOT DETECTED
Escherichia coli: NOT DETECTED
HAEMOPHILUS INFLUENZAE: NOT DETECTED
KLEBSIELLA PNEUMONIAE: NOT DETECTED
Klebsiella oxytoca: NOT DETECTED
Listeria monocytogenes: NOT DETECTED
Methicillin resistance: NOT DETECTED
NEISSERIA MENINGITIDIS: NOT DETECTED
Proteus species: NOT DETECTED
Pseudomonas aeruginosa: NOT DETECTED
STAPHYLOCOCCUS SPECIES: DETECTED — AB
STREPTOCOCCUS PYOGENES: NOT DETECTED
Serratia marcescens: NOT DETECTED
Staphylococcus aureus (BCID): NOT DETECTED
Streptococcus agalactiae: NOT DETECTED
Streptococcus pneumoniae: NOT DETECTED
Streptococcus species: NOT DETECTED

## 2017-12-23 MED FILL — Medication: Qty: 1 | Status: AC

## 2017-12-24 LAB — I-STAT ARTERIAL BLOOD GAS, ED
Acid-base deficit: <30 mmol/L — ABNORMAL HIGH (ref 0.0–2.0)
BICARBONATE: 5.1 mmol/L — AB (ref 20.0–28.0)
O2 SAT: 59 %
PO2 ART: 72 mmHg — AB (ref 83.0–108.0)
Patient temperature: 96.7
TCO2: 7 mmol/L — AB (ref 22–32)
pCO2 arterial: 60.7 mmHg — ABNORMAL HIGH (ref 32.0–48.0)
pH, Arterial: 6.523 — CL (ref 7.350–7.450)

## 2017-12-25 ENCOUNTER — Telehealth: Payer: Self-pay

## 2017-12-25 LAB — CULTURE, BLOOD (ROUTINE X 2): SPECIAL REQUESTS: ADEQUATE

## 2017-12-25 NOTE — Telephone Encounter (Signed)
ER records reviewed, status post cardiac arrest at home, CPR provided on the field, they got a rhythm back, eventually was pronounced dead at the ER. He has a history of mild nonobstructive CAD, COPD, tobacco abuse, HTN, and a number of other comorbidities.  Most likely cause of death is CAD.  Death certificate signed.

## 2017-12-25 NOTE — Telephone Encounter (Signed)
Received Death Certificate from [employer] Forwarded to PCP for completion. Please call [phone]. Also fax to [phone] when complete.

## 2017-12-25 NOTE — Telephone Encounter (Signed)
Form faxed to number provided, also spoke w/ [employer] informing that Death Certificate has been completed and is at front desk for pick up. Copy of Death Certificate sent for scanning.

## 2017-12-27 LAB — CULTURE, BLOOD (ROUTINE X 2)
Culture: NO GROWTH
SPECIAL REQUESTS: ADEQUATE

## 2018-01-11 ENCOUNTER — Ambulatory Visit: Payer: Medicare Other | Admitting: Internal Medicine

## 2018-01-18 NOTE — ED Triage Notes (Signed)
Per EMS:  Pt CPR when EMS arrived.  Family states patient was on the couch, hard to arouse last night, today he was snoring, they noted snoring stopped and called 911.  Pt was down approx 20 minutes in the field, CPR with 4 epis given, asystole on monitor.  Pulses returned and transport took place.  Pt coded again en route, 6 minutes of CPR with 2 epi, HR 60 upon arrival with palpable pulses.  CBG 37, given 1 amp D50.  Hx of opiate use with 2mg  narcan given.

## 2018-01-18 NOTE — ED Provider Notes (Addendum)
[facility] EMERGENCY DEPARTMENT Provider Note   CSN: [id number] Arrival date & time: 01/14/2018  1444     History   Chief Complaint Chief Complaint  Patient presents with  . Cardiac Arrest    HPI Dakota [name] is a 84 y.o. male.  Patient is a 84 year old male with a history of COPD, coronary artery disease, reflux, hypertension, incidental ICA aneurysm and arthritis, status post recent cervical spine fracture in July of this year who presents after his cardiac arrest.  His wife states he was doing okay yesterday morning and got dressed and came downstairs.  They were going to go out and run errands.  During the day he got increasingly lethargic.  He lied on the sofa and went to sleep and essentially never woke up.  He did have snoring type respirations and when the wife noted that the snoring stopped, she called 911.  They state normally he is a really heavy sleeper and thought that he was just sleeping a lot.  He has not had any other recent illnesses.  He was found by EMS to be in asystole.  CPR was started and after about 22 minutes of CPR he got a heart rate back and was in a idioventricular type rhythm.  He got 6 mg of epinephrine.      Past Medical History:  Diagnosis Date  . 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 BPH (benign prostatic hypertrophy)   . CAD (coronary artery disease)    per cath: mild/non obstructive 03/2006  . Chronic neck pain    and HA, dx w/ cervical stenosis  . COPD (chronic obstructive pulmonary disease) (HCC)   . Depression   . Diverticulosis of colon (without mention of hemorrhage)   . Gallbladder & bile duct stone    per [place] 2007  . GERD (gastroesophageal reflux disease)   . Hyperlipidemia   . Hyperplastic colon polyp   . Hypertension   . Lumbar spondylosis    Facet mediated pain, Dr. Ethelene Hal  . Osteopenia   . Peptic ulcer disease   . Pulmonary nodule    s/p Ct x 2 last 05/2008: scarring  . Thyroid  disease     Patient Active Problem List   Diagnosis Date Noted  . PCP NOTES >>>>>> 12/08/2015  . Dysphagia, pharyngoesophageal phase 02/07/2015  . Palpable abd. aorta 01/21/2015  . Vitamin D deficiency 05/08/2014  . Hyperglycemia 07/21/2013  . Hypothyroidism 07/05/2012  . Weight loss -- fatigue-- night sweats 02/20/2012  . PMR (polymyalgia rheumatica) (HCC) 02/20/2012  . DJD (degenerative joint disease) 04/05/2011  . SHORTNESS OF BREATH 07/21/2009  . PREMATURE VENTRICULAR CONTRACTIONS 07/19/2009  . PALPITATIONS, RECURRENT 07/19/2009  . Hypogonadism in male 03/12/2009  . CBC, ABNORMAL 11/02/2008  . ANEURYSM OF ARTERY OF NECK 08/31/2008  . LIVER FUNCTION TESTS, ABNORMAL 05/20/2008  . CORONARY ARTERY DISEASE 02/07/2008  . NODULAR PROSTATE WITHOUT URINARY OBSTRUCTION 02/07/2008  . Osteopenia 02/07/2008  . NECK PAIN, CHRONIC 12/10/2007  . Hyperlipidemia 01/22/2007  . Depression with anxiety 01/22/2007  . CATARACT NOS 01/22/2007  . COPD (chronic obstructive pulmonary disease) (HCC) 01/22/2007  . GERD 01/22/2007  . PEPTIC ULCER DISEASE 01/22/2007  . DIVERTICULOSIS, COLON 01/22/2007  . BPH, SCC of the penis  and prostate nodule 01/22/2007  . HX, PERSONAL, MALIGNANCY, SKIN NEC 01/22/2007  . Elevated LFTs 01/22/2007  . COLONIC POLYPS, HX OF 01/22/2007    Past Surgical History:  Procedure Laterality  Date  . ARTHOSCOPIC ROTAOR CUFF REPAIR     L-2003, R-2004 Dr Ranell Patrick  . FACET JOINT INJECTION  2016   Bilateral L4-5 and L5-S1, Dr. Ethelene Hal  . INGUINAL HERNIA REPAIR     r  . SPINAL CORD STIMULATOR INSERTION  5/11   lower back  . ulcer surgery  1978       Home Medications    Prior to Admission medications   Medication Sig Start Date End Date Taking? Authorizing Provider  albuterol (PROAIR HFA) 108 (90 Base) MCG/ACT inhaler Inhale 2 puffs into the lungs every 6 (six) hours as needed for wheezing or shortness of breath. 11/30/17   Wanda Plump, MD  atorvastatin (LIPITOR) 20 MG  tablet Take 1 tablet (20 mg total) by mouth at bedtime. 11/30/17   Wanda Plump, MD  azelastine (ASTELIN) 0.1 % nasal spray Place 2 sprays into both nostrils at bedtime as needed for rhinitis. Use in each nostril as directed 12/13/17   Wanda Plump, MD  CYCLOSET 0.8 MG TABS Take 1 tablet (0.8 mg total) by mouth daily. 12/07/17   Wanda Plump, MD  diazepam (VALIUM) 5 MG tablet Take 1 tablet (5 mg total) by mouth 2 (two) times daily as needed for muscle spasms. 11/30/17   Wanda Plump, MD  escitalopram (LEXAPRO) 10 MG tablet Take 1.5 tablets (15 mg total) by mouth daily. 11/30/17   Wanda Plump, MD  Esomeprazole Magnesium (NEXIUM PO) Take by mouth. OTC , takes one every AM    [provider]  HYDROmorphone (DILAUDID) 8 MG tablet Take 8 mg by mouth every 4 (four) hours as needed for moderate pain.  03/03/13   [provider]  levothyroxine (SYNTHROID, LEVOTHROID) 75 MCG tablet Take 1 tablet (75 mcg total) by mouth daily before breakfast. 12/07/17   Wanda Plump, MD  mometasone-formoterol [facility]) 100-5 MCG/ACT AERO Inhale 2 puffs into the lungs 2 (two) times daily. 11/30/17   Wanda Plump, MD  predniSONE (DELTASONE) 5 MG tablet Take 10 mg by mouth daily with breakfast.    [provider]  predniSONE (DELTASONE) 5 MG tablet TAKE 3 TABLETS(15 MG) BY MOUTH DAILY WITH BREAKFAST 12/12/17   Wanda Plump, MD  tiotropium (SPIRIVA HANDIHALER) 18 MCG inhalation capsule Place 1 capsule (18 mcg total) into inhaler and inhale daily. 11/30/17   Wanda Plump, MD  triamcinolone (NASACORT ALLERGY 24HR) 55 MCG/ACT AERO nasal inhaler Place 2 sprays into the nose daily.    [provider]  zolpidem (AMBIEN) 5 MG tablet Take 1 tablet (5 mg total) by mouth at bedtime as needed for sleep. 11/30/17   Wanda Plump, MD    Family History Family History  Problem Relation Age of Onset  . Prostate cancer Father   . Prostate cancer Brother        spead to his bladder  . Heart attack Neg Hx   . Diabetes  Neg Hx   . Colon cancer Neg Hx   . Stomach cancer Neg Hx     Social History Social History   Tobacco Use  . Smoking status: Former Games developer  . Smokeless tobacco: Never Used  . Tobacco comment: 2011 aprox  Substance Use Topics  . Alcohol use: Yes    Alcohol/week: 1.2 oz    Types: 2 Shots of liquor per week    Comment: 2 mixed drinks a week  . Drug use: No     Allergies   Sulfonamide derivatives  Review of Systems Review of Systems  Unable to perform ROS: Patient unresponsive     Physical Exam Updated Vital Signs BP (!) 64/51   Pulse 73   Temp (!) 96.8 F (36 C) (Temporal)   Resp 15   SpO2 94%   Physical Exam  Constitutional: He is oriented to person, place, and time. He appears well-developed and well-nourished.  HENT:  Head: Normocephalic and atraumatic.  Eyes:  Pupils 4 mm and nonreactive bilaterally  Neck: Neck supple.  Cardiovascular:  Patient bradycardic in an idioventricular type rhythm  Pulmonary/Chest:  Patient is intubated.  There is breath sounds bilaterally with good air movement he has an end-tidal CO2 of 22 with good waveform  Abdominal: Soft. Bowel sounds are normal. There is no tenderness. There is no rebound and no guarding.  Musculoskeletal: Normal range of motion. He exhibits no edema.  Lymphadenopathy:    He has no cervical adenopathy.  Neurological: He is alert and oriented to person, place, and time.  Skin: Skin is dry. No rash noted.  Patient has severe mottling of his core with ecchymosis throughout his entire back and pelvis  Psychiatric: He has a normal mood and affect.     ED Treatments / Results  Labs (all labs ordered are listed, but only abnormal results are displayed) Labs Reviewed  I-STAT CHEM 8, ED - Abnormal; Notable for the following components:      Result Value   Potassium 8.2 (*)    Chloride 114 (*)    BUN 70 (*)    Creatinine, Ser 4.00 (*)    Glucose, Bld 114 (*)    Calcium, Ion 0.93 (*)    TCO2 10 (*)     Hemoglobin 9.9 (*)    HCT 29.0 (*)    All other components within normal limits  I-STAT CG4 LACTIC ACID, ED - Abnormal; Notable for the following components:   Lactic Acid, Venous 15.91 (*)    All other components within normal limits  I-STAT TROPONIN, ED - Abnormal; Notable for the following components:   Troponin i, poc 2.27 (*)    All other components within normal limits  CULTURE, BLOOD (ROUTINE X 2)  CULTURE, BLOOD (ROUTINE X 2)  COMPREHENSIVE METABOLIC PANEL  CBC WITH DIFFERENTIAL/PLATELET  PROTIME-INR  BLOOD GAS, ARTERIAL  APTT    EKG  EKG Interpretation  Date/Time:  Saturday December 22 2017 14:49:21 EST Ventricular Rate:  49 PR Interval:    QRS Duration: 167 QT Interval:  453 QTC Calculation: 409 R Axis:   -37 Text Interpretation:  Atrial fibrillation Ventricular premature complex Nonspecific IVCD with LAD Baseline wander in lead(s) V1 Confirmed by Rolan BuccoBelfi, Jamarr Treinen [phone]) on 01/07/2018 2:52:43 PM Also confirmed by Rolan BuccoBelfi, Mackenzy Grumbine [phone]), editor Madalyn RobEverhart, Marilyn [phone])  on 12/18/2017 3:10:00 PM       Radiology Dg Chest Port 1 View  Result Date: 01/13/2018 CLINICAL DATA:  Post CPR. EXAM: PORTABLE CHEST 1 VIEW COMPARISON:  09/11/2017. FINDINGS: Cardiomegaly. Dorsal column stimulator. Surgical clips surround the GE junction. ET tube RIGHT mainstem bronchus needs to be withdrawn approximately 7 cm. BILATERAL perihilar pulmonary opacities could represent aspiration pneumonia or perihilar pulmonary edema. These are greater on the RIGHT. No definite effusion or pneumothorax. No visible rib fracture. IMPRESSION: ETT too low, pull back 7 cm. These results were called by telephone at the time of interpretation on 12/27/2017 at 3:16 pm to nurse Waldo LaineKaitlin who verbally acknowledged these results. BILATERAL pulmonary opacities are new from priors, these could represent pneumonia or  edema. Electronically Signed   By: Elsie Stain M.D.   On: 01/13/18 15:17    Procedures Procedures  (including critical care time)  Medications Ordered in ED Medications  sodium chloride 0.9 % bolus 500 mL (500 mLs Intravenous Not Given 01-13-18 1533)  0.9 %  sodium chloride infusion (500 mLs Intravenous New Bag/Given 01/13/18 1453)  EPINEPHrine (ADRENALIN) 1 MG/10ML injection (1 mg Intravenous Given 01-13-2018 1510)  sodium chloride 0.9 % bolus 1,000 mL (1,000 mLs Intravenous New Bag/Given 01-13-2018 1513)  calcium chloride injection (1 g Intravenous Given 13-Jan-2018 1523)  calcium chloride 1 g in sodium chloride 0.9 % 100 mL IVPB (1 g Intravenous Not Given 01/13/18 1531)  albuterol (PROVENTIL) (2.5 MG/3ML) 0.083% nebulizer solution 10 mg (not administered)  insulin aspart (novoLOG) injection 10 Units (not administered)  norepinephrine (LEVOPHED) 4 mg in dextrose 5 % 250 mL (0.016 mg/mL) infusion (17.5 mcg/min Intravenous Rate/Dose Change 01-13-2018 1524)  dextrose 50 % solution 50 mL (50 mLs Intravenous Given 01/13/2018 1534)  sodium bicarbonate injection 50 mEq (50 mEq Intravenous Given 2018/01/13 1534)     Initial Impression / Assessment and Plan / ED Course  I have reviewed the triage vital signs and the nursing notes.  Pertinent labs & imaging results that were available during my care of the patient were reviewed by me and considered in my medical decision making (see chart for details).     Patient is a 84 year old male who presents post cardiac arrest.  He was noted to be bradycardic and hypotensive on arrival.  He was given epinephrine boluses as well as started on Levophed drip.  He was noted to have an idioventricular type rhythm and was also treated for hyperkalemia.  His ET tube was verified.  He had good waveform capnography.  He seemed to have a very poor prognosis given his mottled skin and large amount of ecchymosis to his back.  Potentially could have had etiologies such a ruptured aneurysm.  However he continued to decline and critical care came down and had a discussion with the family.  They made  him DNR and patient expired in the ED.  CRITICAL CARE Performed by: Rolan Bucco Total critical care time: 60 minutes Critical care time was exclusive of separately billable procedures and treating other patients. Critical care was necessary to treat or prevent imminent or life-threatening deterioration. Critical care was time spent personally by me on the following activities: development of treatment plan with patient and/or surrogate as well as nursing, discussions with consultants, evaluation of patient's response to treatment, examination of patient, obtaining history from patient or surrogate, ordering and performing treatments and interventions, ordering and review of laboratory studies, ordering and review of radiographic studies, pulse oximetry and re-evaluation of patient's condition.  Dr. Eudelia Bunch to speak with PCP regarding signing of death certificate  Final Clinical Impressions(s) / ED Diagnoses   Final diagnoses:  Cardiac arrest (HCC)  Lactic acidosis  Hyperkalemia    ED Discharge Orders    None       Rolan Bucco, MD 2018-01-13 1658    Rolan Bucco, MD 01-13-18 1659

## 2018-01-18 NOTE — Progress Notes (Signed)
   January 18, 2018 1600  Clinical Encounter Type  Visited With Patient and family together;Health care provider  Visit Type ED  Referral From Family  Consult/Referral To Chaplain  Spiritual Encounters  Spiritual Needs Grief support;Emotional;Prayer  Stress Factors  Family Stress Factors Loss   Paged to the ED for a CPR in progress.  Met with spouse and 2 children.  Facilitated meeting with the physician.  Escorted family to the trauma room to be bedside.  Prayed and stayed with the family.  Patient died and I supported them and offered prayer.  Provided hospitality and grief support.   Chaplain Katherene Ponto

## 2018-01-18 NOTE — ED Notes (Addendum)
Patient's family at bedside with CCM, Dr. Fredderick PhenixBelfi and clergy

## 2018-01-18 NOTE — Consult Note (Signed)
PCCM Consult    Called to bedside for consultation post cardiac arrest.  The patient reportedly was in normal state of health (up / mobile, independent).  On entry to the exam room, the patient was noted to have levido / mottling purple discoloration. His pressure was in the 30's systolic, lucas device in place.  Labs not returned at time of initial exam.  Obtunded on vent, pupils fixed, not breathing over vent.  Bilateral breath sounds.   Levophed initiated per EDP.  Labs returned - Na 137, K 8.2, Cl 114, BUN 70, Cr 4.0, trop 2.27, lactic acid 15.91, Hgb 9.9, glucose 114. ABG 6.5 / 60 / 72 / 5.1.  Initial CXR demonstrated R>L airspace disease. The family was updated per Dr. Gilmore LarocheBelfie. Brought to bedside per Chaplain.  Family updated on status as labs returned.  Despite increasing levophed, treatment for hyperkalemia and acidosis he continued to decline.  Reviewed findings with family and they agree that he would not want further CPR > DNR in the event of arrest.  Will continue current level of support but no escalation. Likely will expire in minutes to hour.  Dr. Vassie LollAlva at bedside, agrees with plan of care.      CBC    Component Value Date/Time   WBC 7.5 Feb 24, 2018 1504   RBC 3.20 (L) Feb 24, 2018 1504   HGB 9.9 (L) Feb 24, 2018 1513   HCT 29.0 (L) Feb 24, 2018 1513   PLT PENDING Feb 24, 2018 1504   MCV 108.1 (H) Feb 24, 2018 1504   MCH 31.3 Feb 24, 2018 1504   MCHC 28.9 (L) Feb 24, 2018 1504   RDW 15.0 Feb 24, 2018 1504   LYMPHSABS PENDING Feb 24, 2018 1504   MONOABS PENDING Feb 24, 2018 1504   EOSABS PENDING Feb 24, 2018 1504   BASOSABS PENDING Feb 24, 2018 1504   BMP Latest Ref Rng & Units 01/06/2018 09/06/2017 02/19/2017  Glucose 65 - 99 mg/dL 960(A114(H) 81 540(J110(H)  BUN 6 - 20 mg/dL 81(X70(H) 16 15  Creatinine 0.61 - 1.24 mg/dL 9.14(N4.00(H) 8.291.04 5.620.96  Sodium 135 - 145 mmol/L 137 137 139  Potassium 3.5 - 5.1 mmol/L 8.2(HH) 4.1 4.5  Chloride 101 - 111 mmol/L 114(H) 101 104  CO2 19 - 32 mEq/L - 30 27  Calcium 8.4 - 10.5 mg/dL -  9.4 9.5    Canary BrimBrandi Creola Krotz, NP-C [facility] Pgr: [phone] or if no answer [phone] 12/21/2017, 3:51 PM

## 2018-01-18 NOTE — ED Notes (Signed)
Patient's monitor placed on comfort measures, family continues to be at bedside, CCM continues to be nearby, will come back in to speak with family shortly.  Onalee Huaavid, EMT continues pulse checks, and zoll in place.

## 2018-01-18 NOTE — Progress Notes (Addendum)
Called to bedside by RN > no pulse, heart tones or spontaneous respirations.  Verified with Dr. Eudelia Bunchardama, EDP.  Time of death [id number].  Family updated at bedside.  Support offered.  Chaplain with family.   Canary BrimBrandi Elaina Cara, NP-C [facility] Pgr: [phone] or if no answer [phone] 01/01/2018, 4:50 PM

## 2018-01-18 NOTE — ED Notes (Signed)
Radiology at bedside

## 2018-01-18 DEATH — deceased
